# Patient Record
Sex: Male | Born: 1960 | Race: Black or African American | Hispanic: No | Smoking: Never smoker
Health system: Southern US, Community
[De-identification: ages and names within clinical notes are randomized; demographics above are authoritative.]

## PROBLEM LIST (undated history)

## (undated) DIAGNOSIS — T884XXA Failed or difficult intubation, initial encounter: Secondary | ICD-10-CM

## (undated) DIAGNOSIS — I10 Essential (primary) hypertension: Secondary | ICD-10-CM

## (undated) DIAGNOSIS — T8859XA Other complications of anesthesia, initial encounter: Secondary | ICD-10-CM

## (undated) DIAGNOSIS — C801 Malignant (primary) neoplasm, unspecified: Secondary | ICD-10-CM

## (undated) DIAGNOSIS — N189 Chronic kidney disease, unspecified: Secondary | ICD-10-CM

## (undated) DIAGNOSIS — Z8709 Personal history of other diseases of the respiratory system: Secondary | ICD-10-CM

## (undated) DIAGNOSIS — I4891 Unspecified atrial fibrillation: Secondary | ICD-10-CM

## (undated) HISTORY — PX: NO PAST SURGERIES: SHX2092

---

## 2004-02-25 ENCOUNTER — Emergency Department (HOSPITAL_COMMUNITY): Admission: EM | Admit: 2004-02-25 | Discharge: 2004-02-25 | Payer: Self-pay | Admitting: Family Medicine

## 2006-09-16 ENCOUNTER — Emergency Department (HOSPITAL_COMMUNITY): Admission: EM | Admit: 2006-09-16 | Discharge: 2006-09-16 | Payer: Self-pay | Admitting: Emergency Medicine

## 2006-10-01 ENCOUNTER — Emergency Department (HOSPITAL_COMMUNITY): Admission: EM | Admit: 2006-10-01 | Discharge: 2006-10-01 | Payer: Self-pay | Admitting: Emergency Medicine

## 2020-02-18 ENCOUNTER — Ambulatory Visit: Payer: Self-pay

## 2020-02-22 ENCOUNTER — Ambulatory Visit: Payer: Self-pay | Attending: Family

## 2020-02-22 DIAGNOSIS — Z23 Encounter for immunization: Secondary | ICD-10-CM | POA: Insufficient documentation

## 2020-02-22 NOTE — Progress Notes (Signed)
   Covid-19 Vaccination Clinic  Name:  Raymond Clayton    MRN: [id number] DOB: 1961/10/22  02/22/2020  [name] was observed post Covid-19 immunization for 15 minutes without incidence. He was provided with Vaccine Information Sheet and instruction to access the V-Safe system.   [name] was instructed to call 911 with any severe reactions post vaccine: Marland Kitchen Difficulty breathing  . Swelling of your face and throat  . A fast heartbeat  . A bad rash all over your body  . Dizziness and weakness    Immunizations Administered    Name Date Dose VIS Date Route   Moderna COVID-19 Vaccine 02/22/2020 11:26 AM 0.5 mL 12/01/2019 Intramuscular   Manufacturer: Moderna   Lot: [id number]   [place]:[id number]

## 2020-03-22 ENCOUNTER — Ambulatory Visit: Payer: Self-pay | Attending: Family

## 2020-03-22 DIAGNOSIS — Z23 Encounter for immunization: Secondary | ICD-10-CM

## 2020-03-22 NOTE — Progress Notes (Signed)
   Covid-19 Vaccination Clinic  Name:  Raymond Clayton    MRN: [id number] DOB: [birth year]  03/22/2020  [name] was observed post Covid-19 immunization for 30 minutes based on pre-vaccination screening without incident. He was provided with Vaccine Information Sheet and instruction to access the V-Safe system.   [name] was instructed to call 911 with any severe reactions post vaccine: Marland Kitchen Difficulty breathing  . Swelling of face and throat  . A fast heartbeat  . A bad rash all over body  . Dizziness and weakness   Immunizations Administered    Name Date Dose VIS Date Route   Moderna COVID-19 Vaccine 03/22/2020  1:46 PM 0.5 mL 12/01/2019 Intramuscular   Manufacturer: Moderna   LotMV:[id number]   LoloBE:[id number]

## 2021-08-02 NOTE — Progress Notes (Addendum)
GU Location of Tumor / Histology:  Adenocarcinoma of the prostate  If Prostate Cancer, Gleason Score is (3 + 4), PSA (14.2 as of 05/15/21), and Prostate volume (57g)  Raymond Clayton presented with signs/symptoms of: steadily increasing PSA levels, as well as: erectile dysfunction (for over a year), daytime urgency/frequency, occasional hesitancy and weak/intermittent stream, and some postvoid dribbling   Biopsies revealed:  07/20/2021   08/21/2019   Past/Anticipated interventions by urology, if any:  07/28/2021 (office visit) --Dr. Festus Aloe  07/20/2021 Dr. Festus Aloe Transrectal ultrasound of prostate with biopsies   08/21/2019 Dr. Festus Aloe Transrectal ultrasound of prostate with biopsies   Past/Anticipated interventions by medical oncology, if any:  No referral placed at this time  Weight changes, if any: Patient denies  IPSS Score: 8 (moderate) SHIM Score: 14 (mild-moderate ED)  Bowel/Bladder complaints, if any:  Denies any changes in bowel or urinary habits. Reports he would be mostly satisfied if he had to live with his current urinary condition for the rest of his life  Nausea/Vomiting, if any: Patient denies  Pain issues, if any:  Patient denies  SAFETY ISSUES: Prior radiation? No Pacemaker/ICD? No Possible current pregnancy? N/A Is the patient on methotrexate? No  Current Complaints / other details:  Patient has received the first 3 Moderna vaccines

## 2021-08-03 DIAGNOSIS — C61 Malignant neoplasm of prostate: Secondary | ICD-10-CM | POA: Insufficient documentation

## 2021-08-04 ENCOUNTER — Ambulatory Visit
Admission: RE | Admit: 2021-08-04 | Discharge: 2021-08-04 | Disposition: A | Discharge status: Home / Self Care | Payer: Self-pay | Source: Ambulatory Visit | Attending: Radiation Oncology | Admitting: Radiation Oncology

## 2021-08-04 ENCOUNTER — Encounter: Payer: Self-pay | Admitting: Radiation Oncology

## 2021-08-04 ENCOUNTER — Ambulatory Visit: Admission: RE | Admit: 2021-08-04 | Payer: Self-pay | Source: Ambulatory Visit

## 2021-08-04 DIAGNOSIS — C61 Malignant neoplasm of prostate: Secondary | ICD-10-CM

## 2021-08-04 NOTE — Progress Notes (Signed)
Radiation Oncology         [phone] ________________________________  Initial Outpatient Consultation - Conducted via Telephone due to current COVID-19 concerns for limiting patient exposure  Name: Raymond Clayton MRN: [id number]  Date: 08/04/2021  DOB: 07-30-1961  CC:No primary care provider on file.  Festus Aloe, MD   REFERRING PHYSICIAN: Festus Aloe, MD  DIAGNOSIS: 60 y.o. gentleman with Stage T1c adenocarcinoma of the prostate with Gleason score of 3+4, and PSA of 14.2.    ICD-10-CM   1. Malignant neoplasm of prostate ([place])  C61       HISTORY OF PRESENT ILLNESS: Raymond Clayton is a 60 y.o. male with a diagnosis of prostate cancer. He was initially diagnosed with low volume Gleason 3+3 prostate cancer in 08/2019 by Dr. Junious Silk. PSA at that time was 8.3, and digital rectal examination was benign. He appropriately opted for active surveillance.   Since that time, his PSA jumped to 10.7 in 05/2020 and to 14.2 in 04/2021. He proceeded to surveillance transrectal ultrasound with 12 biopsies of the prostate on 07/20/21.  The prostate volume measured 40 cc.  Out of 12 core biopsies, 4 were positive.  The maximum Gleason score was 3+4, and this was seen in the right apex lateral and right apex. Additionally, small foci of Gleason 3+3 were seen in the left base lateral and left mid  The patient reviewed the biopsy results with his urologist and he has kindly been referred today for discussion of potential radiation treatment options. Following his discussion with Dr. Junious Silk, he was adamantly not interested in prostatectomy and most interested in brachytherapy.  PREVIOUS RADIATION THERAPY: No  PAST MEDICAL HISTORY: History reviewed. No pertinent past medical history.    PAST SURGICAL HISTORY:History reviewed. No pertinent surgical history.  FAMILY HISTORY: History reviewed. No pertinent family history.  SOCIAL HISTORY:  Social History   Socioeconomic History    Marital status: Married    Spouse name: Not on file   Number of children: Not on file   Years of education: Not on file   Highest education level: Not on file  Occupational History   Not on file  Tobacco Use   Smoking status: Not on file   Smokeless tobacco: Not on file  Substance and Sexual Activity   Alcohol use: Not on file   Drug use: Not on file   Sexual activity: Not on file  Other Topics Concern   Not on file  Social History Narrative   Not on file   Social Determinants of Health   Financial Resource Strain: Not on file  Food Insecurity: Not on file  Transportation Needs: Not on file  Physical Activity: Not on file  Stress: Not on file  Social Connections: Not on file  Intimate Partner Violence: Not on file    ALLERGIES: Lisinopril  MEDICATIONS:  Current Outpatient Medications  Medication Sig Dispense Refill   amLODipine (NORVASC) 10 MG tablet Take 10 mg by mouth daily.     atorvastatin (LIPITOR) 10 MG tablet Take 10 mg by mouth daily.     losartan-hydrochlorothiazide (HYZAAR) 50-12.5 MG tablet Take 1 tablet by mouth daily.     metoprolol tartrate (LOPRESSOR) 100 MG tablet Take 100 mg by mouth in the morning and at bedtime.     Rivaroxaban (XARELTO) 15 MG TABS tablet Take 1 tablet by mouth daily.     Cholecalciferol (VITAMIN D3) 10 MCG (400 UNIT) tablet Take 400 Units by mouth daily.     No current facility-administered  medications for this encounter.    REVIEW OF SYSTEMS:  On review of systems, the patient reports that he is doing well overall. He denies any chest pain, shortness of breath, cough, fevers, chills, night sweats, unintended weight changes. He denies any bowel disturbances, and denies abdominal pain, nausea or vomiting. He denies any new musculoskeletal or joint aches or pains. His IPSS was 8, indicating mild urinary symptoms. His SHIM was 14, indicating he has moderate erectile dysfunction. A complete review of systems is obtained and is otherwise  negative.    PHYSICAL EXAM:  Wt Readings from Last 3 Encounters:  No data found for Wt   Temp Readings from Last 3 Encounters:  No data found for Temp   BP Readings from Last 3 Encounters:  No data found for BP   Pulse Readings from Last 3 Encounters:  No data found for Pulse    /10  Physical exam not performed in light of telephone consult visit format.   KPS = 100  100 - Normal; no complaints; no evidence of disease. 90   - Able to carry on normal activity; minor signs or symptoms of disease. 80   - Normal activity with effort; some signs or symptoms of disease. 57   - Cares for self; unable to carry on normal activity or to do active work. 60   - Requires occasional assistance, but is able to care for most of his personal needs. 50   - Requires considerable assistance and frequent medical care. 17   - Disabled; requires special care and assistance. 92   - Severely disabled; hospital admission is indicated although death not imminent. 14   - Very sick; hospital admission necessary; active supportive treatment necessary. 10   - Moribund; fatal processes progressing rapidly. 0     - Dead  Karnofsky DA, Abelmann WH, Craver LS and Burchenal JH [phone]) The use of the nitrogen mustards in the palliative treatment of carcinoma: with particular reference to bronchogenic carcinoma Cancer 1 634-56  LABORATORY DATA:  No results found for: WBC, HGB, HCT, MCV, PLT No results found for: NA, K, CL, CO2 No results found for: ALT, AST, GGT, ALKPHOS, BILITOT   RADIOGRAPHY: No results found.    IMPRESSION/PLAN: This visit was conducted via Telephone to spare the patient unnecessary potential exposure in the healthcare setting during the current COVID-19 pandemic. 1. 60 y.o. gentleman with Stage T1c adenocarcinoma of the prostate with Gleason Score of 3+4, and PSA of 14.2. We discussed the patient's workup and outlined the nature of prostate cancer in this setting. The patient's T stage,  Gleason's score, and PSA put him into the intermediate risk group. Accordingly, he is eligible for a variety of potential treatment options including brachytherapy, 5.5 weeks of external radiation, or prostatectomy. We discussed the available radiation techniques, and focused on the details and logistics and delivery. We discussed and outlined the risks, benefits, short and long-term effects associated with radiotherapy and compared and contrasted these with prostatectomy. We discussed the role of SpaceOAR in reducing the rectal toxicity associated with radiotherapy. He appears to have a good understanding of his disease and our treatment recommendations which are of curative intent.  He was encouraged to ask questions that were answered to his stated satisfaction.  At the end of the conversation the patient is interested in moving forward with brachytherapy and use of SpaceOAR gel to reduce rectal toxicity from radiotherapy.  We will share our discussion with Dr. Junious Silk and move forward  with scheduling his [facility] planning appointment in the near future.  The patient will be contacted by Romie Jumper in our office who will be working closely with him to coordinate OR scheduling and pre and post procedure appointments.  We will contact the pharmaceutical rep to ensure that [employer] is available at the time of procedure.  We enjoyed meeting him today and look forward to continuing to participate in his care.  Given current concerns for patient exposure during the COVID-19 pandemic, this encounter was conducted via telephone. The patient was notified in advance and was offered a MyChart meeting to allow for face to face communication but unfortunately reported that he did not have the appropriate resources/technology to support such a visit and instead preferred to proceed with telephone consult. The patient has given verbal consent for this type of encounter. The attendants for this meeting include Tyler Pita MD, Ashlyn Bruning PA-C, [place], and patient, Wenceslao Gromek. During the encounter, Tyler Pita MD, Ashlyn Bruning PA-C, and scribe, Wilburn Mylar were located at [place].  Patient, Shinichi Deguia was located at home.  We personally spent 60 minutes in this encounter including chart review, reviewing radiological studies, meeting face-to-face with the patient, entering orders and completing documentation.   Nicholos Johns, PA-C    Tyler Pita, MD  [facility] Direct Dial: [phone]  Fax: [phone] .com  Skype  LinkedIn  This document serves as a record of services personally performed by Tyler Pita, MD and Freeman Caldron, PA-C. It was created on their behalf by Wilburn Mylar, a trained medical scribe. The creation of this record is based on the scribe's personal observations and the provider's statements to them. This document has been checked and approved by the attending provider.

## 2021-08-07 ENCOUNTER — Telehealth: Payer: Self-pay | Admitting: *Deleted

## 2021-08-07 NOTE — Telephone Encounter (Signed)
CALLED PATIENT TO ASK QUESTIONS, SPOKE WITH PATIENT 

## 2021-08-16 ENCOUNTER — Telehealth: Payer: Self-pay | Admitting: *Deleted

## 2021-08-16 ENCOUNTER — Other Ambulatory Visit: Payer: Self-pay | Admitting: Urology

## 2021-08-16 DIAGNOSIS — C61 Malignant neoplasm of prostate: Secondary | ICD-10-CM

## 2021-08-16 NOTE — Telephone Encounter (Signed)
CALLED PATIENT TO INFORM OF IMPLANT DATE, LVM FOR A RETURN CALL 

## 2021-08-17 ENCOUNTER — Telehealth: Payer: Self-pay | Admitting: *Deleted

## 2021-08-17 NOTE — Telephone Encounter (Signed)
Returned patient's phone call, spoke with patient 

## 2021-10-04 ENCOUNTER — Telehealth: Payer: Self-pay | Admitting: *Deleted

## 2021-10-04 NOTE — Telephone Encounter (Signed)
CALLED PATIENT TO REMIND OF PRE-SEED APPTS. FOR 10-05-21, SPOKE WITH PATIENT AND HE IS AWARE OF THESE APPTS.

## 2021-10-05 ENCOUNTER — Ambulatory Visit
Admission: RE | Admit: 2021-10-05 | Discharge: 2021-10-05 | Disposition: A | Discharge status: Home / Self Care | Payer: 59 | Source: Ambulatory Visit | Attending: Urology | Admitting: Urology

## 2021-10-05 ENCOUNTER — Encounter (HOSPITAL_COMMUNITY)
Admission: RE | Admit: 2021-10-05 | Discharge: 2021-10-05 | Disposition: A | Discharge status: Home / Self Care | Payer: 59 | Source: Ambulatory Visit | Attending: Urology | Admitting: Urology

## 2021-10-05 ENCOUNTER — Ambulatory Visit
Admission: RE | Admit: 2021-10-05 | Discharge: 2021-10-05 | Disposition: A | Discharge status: Home / Self Care | Payer: 59 | Source: Ambulatory Visit | Attending: Radiation Oncology | Admitting: Radiation Oncology

## 2021-10-05 ENCOUNTER — Encounter: Payer: Self-pay | Admitting: Urology

## 2021-10-05 ENCOUNTER — Other Ambulatory Visit: Payer: Self-pay

## 2021-10-05 ENCOUNTER — Ambulatory Visit (HOSPITAL_COMMUNITY)
Admission: RE | Admit: 2021-10-05 | Discharge: 2021-10-05 | Disposition: A | Discharge status: Home / Self Care | Payer: 59 | Source: Ambulatory Visit | Attending: Urology | Admitting: Urology

## 2021-10-05 DIAGNOSIS — C61 Malignant neoplasm of prostate: Secondary | ICD-10-CM | POA: Insufficient documentation

## 2021-10-05 NOTE — Progress Notes (Signed)
  Radiation Oncology         [phone] ________________________________  Name: Raymond Clayton MRN: [id number]  Date: 10/05/2021  DOB: 1961-06-19  SIMULATION AND TREATMENT PLANNING NOTE PUBIC ARCH STUDY  CC:No primary care provider on file.  Festus Aloe, MD  DIAGNOSIS: 60 y.o. gentleman with Stage T1c adenocarcinoma of the prostate with Gleason score of 3+4, and PSA of 14.2.  Oncology History  Malignant neoplasm of prostate ([place])  07/20/2021 Cancer Staging   Staging form: Prostate, AJCC 8th Edition - Clinical stage from 07/20/2021: Stage IIB (cT1c, cN0, cM0, PSA: 14.2, Grade Group: 2) - Signed by Freeman Caldron, PA-C on 08/03/2021 Histopathologic type: Adenocarcinoma, NOS Stage prefix: Initial diagnosis Prostate specific antigen (PSA) range: 10 to 19 Gleason primary pattern: 3 Gleason secondary pattern: 4 Gleason score: 7 Histologic grading system: 5 grade system Number of biopsy cores examined: 12 Number of biopsy cores positive: 4 Location of positive needle core biopsies: Both sides   08/03/2021 Initial Diagnosis   Malignant neoplasm of prostate ([place])       ICD-10-CM   1. Malignant neoplasm of prostate ([place])  C61       COMPLEX SIMULATION:  The patient presented today for evaluation for possible prostate seed implant. He was brought to the radiation planning suite and placed supine on the CT couch. A 3-dimensional image study set was obtained in upload to the planning computer. There, on each axial slice, I contoured the prostate gland. Then, using three-dimensional radiation planning tools I reconstructed the prostate in view of the structures from the transperineal needle pathway to assess for possible pubic arch interference. In doing so, I did not appreciate any pubic arch interference. Also, the patient's prostate volume was estimated based on the drawn structure. The volume was 42 cc.  Given the pubic arch appearance and prostate volume, patient remains a good  candidate to proceed with prostate seed implant. Today, he freely provided informed written consent to proceed.    PLAN: The patient will undergo prostate seed implant.   ________________________________  Sheral Apley. Tammi Klippel, M.D.

## 2021-10-05 NOTE — Progress Notes (Addendum)
Patient reports doing well. No symptoms reported at this time.  No current urinary management medications and no urology follow-up scheduled at this time.  Meaningful [place] e complete.

## 2021-10-30 ENCOUNTER — Other Ambulatory Visit: Payer: Self-pay

## 2021-10-30 ENCOUNTER — Telehealth: Payer: Self-pay | Admitting: *Deleted

## 2021-10-30 ENCOUNTER — Encounter (HOSPITAL_BASED_OUTPATIENT_CLINIC_OR_DEPARTMENT_OTHER): Payer: Self-pay | Admitting: Urology

## 2021-10-30 DIAGNOSIS — R3912 Poor urinary stream: Secondary | ICD-10-CM

## 2021-10-30 HISTORY — DX: Poor urinary stream: R39.12

## 2021-10-30 NOTE — Progress Notes (Addendum)
Spoke w/ via phone for pre-op interview---pt Lab needs dos----   addendum:  pt ( made connie mabe aware pt elevated at pre op labs  , please make dr eskridge aware will repeat pt day of surgery)       Lab results------lab appt  11-01-2021 930 am for cbc cmp pt ptt COVID test -----patient states asymptomatic no test needed Arrive at -------1015 am 11-03-2021 NPO after MN NO Solid Food.  Clear liquids from MN until---915 am Med rec completed Medications to take morning of surgery -----amlodipine, atorvastatin, metoprolol tartrate Diabetic medication -----n/a Patient instructed no nail polish to be worn day of surgery Patient instructed to bring photo id and insurance card day of surgery Patient aware to have Driver (ride ) / caregiver    for 24 hours after surgery  pt aware will need driver caregiver for day of surgery and to call connie at dr eskridge before lab appt 11-01-2021 if cannot arrange driver Patient Special Instructions -----fleets enema am of surgery Pre-Op special Istructions -----none Patient verbalized understanding of instructions that were given at this phone interview. Patient denies shortness of breath, chest pain, fever, cough at this phone interview.   Anesthesia Review:  PCP: dr Lonell Face 05-15-2021 [facility] Cardiologist :dr Celine Ahr 07-07-2021 [facility] nephrology dr Candy Sledge 08-01-2021 [facility] ( stage 3 b ckd) Chest x-ray :10-05-2021 chart/epic EKG :10-05-2021 chart/epic Echo :03-07-2021 [facility] Stress test:none Cardiac Cath : none Activity level: does own housework and can climb flight of stairs without problems Sleep Study/ CPAP :none Blood Thinner/ Instructions /Last Dose:patient aware last dose of xarelto will be 10-30-2021 per  note on chart dr Chrissie Noa rhinehart dated 08-16-2021 on chart. :

## 2021-10-30 NOTE — Telephone Encounter (Signed)
Called patient to remind of  lab appt. for 11-01-21 @ 9::30 am @ [facility], spoke with patient and he is aware of this appt.

## 2021-11-01 ENCOUNTER — Other Ambulatory Visit: Payer: Self-pay

## 2021-11-01 ENCOUNTER — Encounter (HOSPITAL_COMMUNITY)
Admission: RE | Admit: 2021-11-01 | Discharge: 2021-11-01 | Disposition: A | Discharge status: Home / Self Care | Payer: 59 | Source: Ambulatory Visit | Attending: Urology | Admitting: Urology

## 2021-11-01 DIAGNOSIS — Z01812 Encounter for preprocedural laboratory examination: Secondary | ICD-10-CM | POA: Insufficient documentation

## 2021-11-01 LAB — COMPREHENSIVE METABOLIC PANEL
ALT: 17 U/L (ref 0–44)
AST: 19 U/L (ref 15–41)
Albumin: 3.6 g/dL (ref 3.5–5.0)
Alkaline Phosphatase: 40 U/L (ref 38–126)
Anion gap: 8 (ref 5–15)
BUN: 13 mg/dL (ref 8–23)
CO2: 26 mmol/L (ref 22–32)
Calcium: 8.9 mg/dL (ref 8.9–10.3)
Chloride: 103 mmol/L (ref 98–111)
Creatinine, Ser: 1.81 mg/dL — ABNORMAL HIGH (ref 0.61–1.24)
GFR, Estimated: 42 mL/min — ABNORMAL LOW (ref >60)
Glucose, Bld: 106 mg/dL — ABNORMAL HIGH (ref 70–99)
Potassium: 3.7 mmol/L (ref 3.5–5.1)
Sodium: 137 mmol/L (ref 135–145)
Total Bilirubin: 1.4 mg/dL — ABNORMAL HIGH (ref 0.3–1.2)
Total Protein: 8 g/dL (ref 6.5–8.1)

## 2021-11-01 LAB — CBC
HCT: 49.1 % (ref 39.0–52.0)
Hemoglobin: 16.2 g/dL (ref 13.0–17.0)
MCH: 28.7 pg (ref 26.0–34.0)
MCHC: 33 g/dL (ref 30.0–36.0)
MCV: 87.1 fL (ref 80.0–100.0)
Platelets: 234 10*3/uL (ref 150–400)
RBC: 5.64 MIL/uL (ref 4.22–5.81)
RDW: 14.4 % (ref 11.5–15.5)
WBC: 6.5 10*3/uL (ref 4.0–10.5)
nRBC: 0 % (ref 0.0–0.2)

## 2021-11-01 LAB — APTT: aPTT: 30 seconds (ref 24–36)

## 2021-11-01 LAB — PROTIME-INR
INR: 1.2 (ref 0.8–1.2)
Prothrombin Time: 15.6 seconds — ABNORMAL HIGH (ref 11.4–15.2)

## 2021-11-02 ENCOUNTER — Telehealth: Payer: Self-pay | Admitting: *Deleted

## 2021-11-02 NOTE — Telephone Encounter (Signed)
CALLED PATIENT TO REMIND OF PROCEDURE FOR 11-03-21, LVM FOR A RETURN CALL

## 2021-11-03 ENCOUNTER — Encounter (HOSPITAL_BASED_OUTPATIENT_CLINIC_OR_DEPARTMENT_OTHER)
Admission: RE | Disposition: A | Discharge status: Home / Self Care | Payer: Self-pay | Source: Ambulatory Visit | Attending: Urology

## 2021-11-03 ENCOUNTER — Ambulatory Visit (HOSPITAL_BASED_OUTPATIENT_CLINIC_OR_DEPARTMENT_OTHER)
Admission: RE | Admit: 2021-11-03 | Discharge: 2021-11-03 | Disposition: A | Discharge status: Home / Self Care | Payer: 59 | Source: Ambulatory Visit | Attending: Urology | Admitting: Urology

## 2021-11-03 ENCOUNTER — Ambulatory Visit (HOSPITAL_BASED_OUTPATIENT_CLINIC_OR_DEPARTMENT_OTHER): Payer: 59 | Admitting: Anesthesiology

## 2021-11-03 ENCOUNTER — Other Ambulatory Visit: Payer: Self-pay

## 2021-11-03 ENCOUNTER — Ambulatory Visit (HOSPITAL_COMMUNITY): Payer: 59

## 2021-11-03 ENCOUNTER — Encounter (HOSPITAL_BASED_OUTPATIENT_CLINIC_OR_DEPARTMENT_OTHER): Payer: Self-pay | Admitting: Urology

## 2021-11-03 DIAGNOSIS — C61 Malignant neoplasm of prostate: Secondary | ICD-10-CM | POA: Insufficient documentation

## 2021-11-03 DIAGNOSIS — Z888 Allergy status to other drugs, medicaments and biological substances status: Secondary | ICD-10-CM | POA: Diagnosis not present

## 2021-11-03 DIAGNOSIS — N1832 Chronic kidney disease, stage 3b: Secondary | ICD-10-CM | POA: Diagnosis not present

## 2021-11-03 DIAGNOSIS — Z7901 Long term (current) use of anticoagulants: Secondary | ICD-10-CM | POA: Insufficient documentation

## 2021-11-03 DIAGNOSIS — I129 Hypertensive chronic kidney disease with stage 1 through stage 4 chronic kidney disease, or unspecified chronic kidney disease: Secondary | ICD-10-CM | POA: Diagnosis not present

## 2021-11-03 DIAGNOSIS — I4891 Unspecified atrial fibrillation: Secondary | ICD-10-CM | POA: Diagnosis not present

## 2021-11-03 DIAGNOSIS — T884XXA Failed or difficult intubation, initial encounter: Secondary | ICD-10-CM

## 2021-11-03 DIAGNOSIS — Z01818 Encounter for other preprocedural examination: Secondary | ICD-10-CM

## 2021-11-03 HISTORY — DX: Failed or difficult intubation, initial encounter: T88.4XXA

## 2021-11-03 HISTORY — PX: SPACE OAR INSTILLATION: SHX6769

## 2021-11-03 HISTORY — DX: Personal history of other diseases of the respiratory system: Z87.09

## 2021-11-03 HISTORY — DX: Other complications of anesthesia, initial encounter: T88.59XA

## 2021-11-03 HISTORY — DX: Unspecified atrial fibrillation: I48.91

## 2021-11-03 HISTORY — DX: Chronic kidney disease, unspecified: N18.9

## 2021-11-03 HISTORY — PX: RADIOACTIVE SEED IMPLANT: SHX5150

## 2021-11-03 HISTORY — DX: Essential (primary) hypertension: I10

## 2021-11-03 HISTORY — DX: Malignant (primary) neoplasm, unspecified: C80.1

## 2021-11-03 LAB — PROTIME-INR
INR: 1 (ref 0.8–1.2)
Prothrombin Time: 13.1 seconds (ref 11.4–15.2)

## 2021-11-03 LAB — URINALYSIS, ROUTINE W REFLEX MICROSCOPIC
Bilirubin Urine: NEGATIVE
Glucose, UA: NEGATIVE mg/dL
Hgb urine dipstick: NEGATIVE
Ketones, ur: NEGATIVE mg/dL
Leukocytes,Ua: NEGATIVE
Nitrite: NEGATIVE
Protein, ur: NEGATIVE mg/dL
Specific Gravity, Urine: 1.018 (ref 1.005–1.030)
pH: 5 (ref 5.0–8.0)

## 2021-11-03 LAB — PSA: Prostatic Specific Antigen: 16.3 ng/mL — ABNORMAL HIGH (ref 0.00–4.00)

## 2021-11-03 SURGERY — INSERTION, RADIATION SOURCE, PROSTATE
Anesthesia: General | Site: Prostate

## 2021-11-03 MED ORDER — PROPOFOL 10 MG/ML IV BOLUS
INTRAVENOUS | Status: DC | PRN
  Administered 2021-11-03: 200 mg via INTRAVENOUS
  Administered 2021-11-03 (×2): 50 mg via INTRAVENOUS

## 2021-11-03 MED ORDER — ALBUTEROL SULFATE HFA 108 (90 BASE) MCG/ACT IN AERS
INHALATION_SPRAY | RESPIRATORY_TRACT | Status: DC | PRN
  Administered 2021-11-03 (×2): 2 via RESPIRATORY_TRACT
  Administered 2021-11-03: 6 via RESPIRATORY_TRACT
  Administered 2021-11-03: 2 via RESPIRATORY_TRACT

## 2021-11-03 MED ORDER — FENTANYL CITRATE (PF) 100 MCG/2ML IJ SOLN
INTRAMUSCULAR | Status: AC
  Filled 2021-11-03: qty 2

## 2021-11-03 MED ORDER — ONDANSETRON HCL 4 MG/2ML IJ SOLN
INTRAMUSCULAR | Status: AC
  Filled 2021-11-03: qty 2

## 2021-11-03 MED ORDER — METHYLPREDNISOLONE SODIUM SUCC 125 MG IJ SOLR
INTRAMUSCULAR | Status: DC | PRN
  Administered 2021-11-03: 125 mg via INTRAVENOUS

## 2021-11-03 MED ORDER — PHENYLEPHRINE 40 MCG/ML (10ML) SYRINGE FOR IV PUSH (FOR BLOOD PRESSURE SUPPORT)
PREFILLED_SYRINGE | INTRAVENOUS | Status: AC
  Filled 2021-11-03: qty 10

## 2021-11-03 MED ORDER — SUCCINYLCHOLINE CHLORIDE 200 MG/10ML IV SOSY
PREFILLED_SYRINGE | INTRAVENOUS | Status: DC | PRN
  Administered 2021-11-03: 140 mg via INTRAVENOUS

## 2021-11-03 MED ORDER — SODIUM CHLORIDE 0.9 % IR SOLN
Status: DC | PRN
  Administered 2021-11-03: 1000 mL

## 2021-11-03 MED ORDER — OXYCODONE-ACETAMINOPHEN 7.5-325 MG PO TABS: 1.0000 | ORAL_TABLET | ORAL | 0 refills | Status: DC | PRN

## 2021-11-03 MED ORDER — ONDANSETRON HCL 4 MG/2ML IJ SOLN
INTRAMUSCULAR | Status: DC | PRN
  Administered 2021-11-03: 4 mg via INTRAVENOUS

## 2021-11-03 MED ORDER — OXYCODONE HCL 5 MG PO TABS: 5.0000 mg | ORAL_TABLET | Freq: Once | ORAL | Status: DC | PRN

## 2021-11-03 MED ORDER — CEPHALEXIN 500 MG PO CAPS: 500.0000 mg | ORAL_CAPSULE | Freq: Three times a day (TID) | ORAL | 0 refills | Status: AC

## 2021-11-03 MED ORDER — RIVAROXABAN 15 MG PO TABS: 15.0000 mg | ORAL_TABLET | Freq: Every day | ORAL | Status: AC

## 2021-11-03 MED ORDER — IOHEXOL 300 MG/ML  SOLN
INTRAMUSCULAR | Status: DC | PRN
  Administered 2021-11-03: 7 mL

## 2021-11-03 MED ORDER — ALBUTEROL SULFATE HFA 108 (90 BASE) MCG/ACT IN AERS
INHALATION_SPRAY | RESPIRATORY_TRACT | Status: AC
  Filled 2021-11-03: qty 6.7

## 2021-11-03 MED ORDER — MIDAZOLAM HCL 2 MG/2ML IJ SOLN
INTRAMUSCULAR | Status: AC
  Filled 2021-11-03: qty 2

## 2021-11-03 MED ORDER — HYDROMORPHONE HCL 1 MG/ML IJ SOLN: 0.2500 mg | INTRAMUSCULAR | Status: DC | PRN

## 2021-11-03 MED ORDER — ONDANSETRON HCL 4 MG/2ML IJ SOLN
4.0000 mg | Freq: Once | INTRAMUSCULAR | Status: DC | PRN
Start: 2021-11-03 — End: 2021-11-03

## 2021-11-03 MED ORDER — CIPROFLOXACIN IN D5W 400 MG/200ML IV SOLN
INTRAVENOUS | Status: AC
  Filled 2021-11-03: qty 200

## 2021-11-03 MED ORDER — SODIUM CHLORIDE 0.9 % IV SOLN: INTRAVENOUS | Status: DC | PRN

## 2021-11-03 MED ORDER — DEXAMETHASONE SODIUM PHOSPHATE 10 MG/ML IJ SOLN
INTRAMUSCULAR | Status: DC | PRN
  Administered 2021-11-03 (×2): 10 mg via INTRAVENOUS

## 2021-11-03 MED ORDER — ACETAMINOPHEN 500 MG PO TABS
ORAL_TABLET | ORAL | Status: AC
  Filled 2021-11-03: qty 2

## 2021-11-03 MED ORDER — PHENYLEPHRINE HCL-NACL 20-0.9 MG/250ML-% IV SOLN
INTRAVENOUS | Status: DC | PRN
  Administered 2021-11-03: 20 ug/min via INTRAVENOUS

## 2021-11-03 MED ORDER — PHENYLEPHRINE HCL (PRESSORS) 10 MG/ML IV SOLN
INTRAVENOUS | Status: AC
  Filled 2021-11-03: qty 2

## 2021-11-03 MED ORDER — DEXMEDETOMIDINE (PRECEDEX) IN NS 20 MCG/5ML (4 MCG/ML) IV SYRINGE
PREFILLED_SYRINGE | INTRAVENOUS | Status: AC
  Filled 2021-11-03: qty 5

## 2021-11-03 MED ORDER — LIDOCAINE 2% (20 MG/ML) 5 ML SYRINGE
INTRAMUSCULAR | Status: DC | PRN
  Administered 2021-11-03: 100 mg via INTRAVENOUS

## 2021-11-03 MED ORDER — FENTANYL CITRATE (PF) 100 MCG/2ML IJ SOLN
INTRAMUSCULAR | Status: DC | PRN
  Administered 2021-11-03: 25 ug via INTRAVENOUS
  Administered 2021-11-03: 50 ug via INTRAVENOUS
  Administered 2021-11-03: 25 ug via INTRAVENOUS
  Administered 2021-11-03: 50 ug via INTRAVENOUS

## 2021-11-03 MED ORDER — DEXMEDETOMIDINE (PRECEDEX) IN NS 20 MCG/5ML (4 MCG/ML) IV SYRINGE
PREFILLED_SYRINGE | INTRAVENOUS | Status: DC | PRN
  Administered 2021-11-03: 12 ug via INTRAVENOUS

## 2021-11-03 MED ORDER — AMISULPRIDE (ANTIEMETIC) 5 MG/2ML IV SOLN: 10.0000 mg | Freq: Once | INTRAVENOUS | Status: DC | PRN

## 2021-11-03 MED ORDER — 0.9 % SODIUM CHLORIDE (POUR BTL) OPTIME: TOPICAL | Status: DC | PRN

## 2021-11-03 MED ORDER — ACETAMINOPHEN 500 MG PO TABS
1000.0000 mg | ORAL_TABLET | Freq: Once | ORAL | Status: AC
  Administered 2021-11-03: 1000 mg via ORAL

## 2021-11-03 MED ORDER — PROPOFOL 10 MG/ML IV BOLUS
INTRAVENOUS | Status: AC
  Filled 2021-11-03: qty 20

## 2021-11-03 MED ORDER — DEXAMETHASONE SODIUM PHOSPHATE 10 MG/ML IJ SOLN
INTRAMUSCULAR | Status: AC
  Filled 2021-11-03: qty 1

## 2021-11-03 MED ORDER — SODIUM CHLORIDE FLUSH 0.9 % IV SOLN
INTRAVENOUS | Status: DC | PRN
  Administered 2021-11-03: 3 mL via INTRAVENOUS

## 2021-11-03 MED ORDER — DOCUSATE SODIUM 100 MG PO CAPS: 100.0000 mg | ORAL_CAPSULE | Freq: Two times a day (BID) | ORAL | 0 refills | Status: AC

## 2021-11-03 MED ORDER — SODIUM CHLORIDE 0.9 % IV SOLN: INTRAVENOUS | Status: DC

## 2021-11-03 MED ORDER — CIPROFLOXACIN IN D5W 400 MG/200ML IV SOLN
400.0000 mg | INTRAVENOUS | Status: AC
  Administered 2021-11-03: 400 mg via INTRAVENOUS

## 2021-11-03 MED ORDER — PHENYLEPHRINE 40 MCG/ML (10ML) SYRINGE FOR IV PUSH (FOR BLOOD PRESSURE SUPPORT)
PREFILLED_SYRINGE | INTRAVENOUS | Status: DC | PRN
  Administered 2021-11-03: 80 ug via INTRAVENOUS
  Administered 2021-11-03 (×2): 120 ug via INTRAVENOUS
  Administered 2021-11-03: 80 ug via INTRAVENOUS

## 2021-11-03 MED ORDER — OXYCODONE HCL 5 MG/5ML PO SOLN
5.0000 mg | Freq: Once | ORAL | Status: DC | PRN
Start: 2021-11-03 — End: 2021-11-03

## 2021-11-03 MED ORDER — LIDOCAINE 2% (20 MG/ML) 5 ML SYRINGE
INTRAMUSCULAR | Status: AC
  Filled 2021-11-03: qty 5

## 2021-11-03 MED ORDER — FLEET ENEMA 7-19 GM/118ML RE ENEM: 1.0000 | ENEMA | Freq: Once | RECTAL | Status: DC

## 2021-11-03 MED ORDER — TAMSULOSIN HCL 0.4 MG PO CAPS: 0.4000 mg | ORAL_CAPSULE | Freq: Every day | ORAL | 0 refills | Status: AC

## 2021-11-03 MED ORDER — METHYLPREDNISOLONE SODIUM SUCC 125 MG IJ SOLR
INTRAMUSCULAR | Status: AC
  Filled 2021-11-03: qty 2

## 2021-11-03 MED ORDER — MIDAZOLAM HCL 5 MG/5ML IJ SOLN
INTRAMUSCULAR | Status: DC | PRN
  Administered 2021-11-03: 2 mg via INTRAVENOUS

## 2021-11-03 SURGICAL SUPPLY — 38 items
BAG DRN RND TRDRP ANRFLXCHMBR (UROLOGICAL SUPPLIES) ×1
BAG URINE DRAIN 2000ML AR STRL (UROLOGICAL SUPPLIES) ×2 IMPLANT
BLADE CLIPPER SENSICLIP SURGIC (BLADE) ×2 IMPLANT
Brachytherapy QuickLink Delivery System Seed Cartr ×1 IMPLANT
CATH FOLEY 2WAY SLVR  5CC 16FR (CATHETERS) ×2
CATH FOLEY 2WAY SLVR 5CC 16FR (CATHETERS) ×1 IMPLANT
CATH ROBINSON RED A/P 16FR (CATHETERS) IMPLANT
CATH ROBINSON RED A/P 20FR (CATHETERS) ×2 IMPLANT
CLOTH BEACON ORANGE TIMEOUT ST (SAFETY) ×2 IMPLANT
CNTNR URN SCR LID CUP LEK RST (MISCELLANEOUS) ×3 IMPLANT
CONT SPEC 4OZ STRL OR WHT (MISCELLANEOUS) ×6
COVER BACK TABLE 60X90IN (DRAPES) ×2 IMPLANT
COVER MAYO STAND STRL (DRAPES) ×2 IMPLANT
DECANTER SPIKE VIAL GLASS SM (MISCELLANEOUS) ×1 IMPLANT
DRSG TEGADERM 4X4.75 (GAUZE/BANDAGES/DRESSINGS) ×3 IMPLANT
DRSG TEGADERM 8X12 (GAUZE/BANDAGES/DRESSINGS) ×4 IMPLANT
GAUZE SPONGE 4X4 12PLY STRL LF (GAUZE/BANDAGES/DRESSINGS) ×2 IMPLANT
GLOVE SURG ENC MOIS LTX SZ6.5 (GLOVE) ×2 IMPLANT
GLOVE SURG ENC MOIS LTX SZ7.5 (GLOVE) ×2 IMPLANT
GLOVE SURG ENC MOIS LTX SZ8 (GLOVE) IMPLANT
GLOVE SURG NEOP MICRO LF SZ6.5 (GLOVE) ×1 IMPLANT
GLOVE SURG ORTHO LTX SZ8.5 (GLOVE) ×3 IMPLANT
GLOVE SURG UNDER POLY LF SZ6.5 (GLOVE) ×2 IMPLANT
GLOVE SURG UNDER POLY LF SZ7 (GLOVE) ×2 IMPLANT
GOWN STRL REUS W/TWL LRG LVL3 (GOWN DISPOSABLE) ×6 IMPLANT
HOLDER FOLEY CATH W/STRAP (MISCELLANEOUS) ×2 IMPLANT
IMPL SPACEOAR VUE SYSTEM (Spacer) ×1 IMPLANT
IMPLANT SPACEOAR VUE SYSTEM (Spacer) ×2 IMPLANT
IV NS 1000ML (IV SOLUTION) ×2
IV NS 1000ML BAXH (IV SOLUTION) ×1 IMPLANT
KIT TURNOVER CYSTO (KITS) ×2 IMPLANT
PACK CYSTO (CUSTOM PROCEDURE TRAY) ×2 IMPLANT
SURGILUBE 2OZ TUBE FLIPTOP (MISCELLANEOUS) ×2 IMPLANT
SUT BONE WAX W31G (SUTURE) IMPLANT
SYR 10ML LL (SYRINGE) ×2 IMPLANT
TOWEL OR 17X26 10 PK STRL BLUE (TOWEL DISPOSABLE) ×2 IMPLANT
UNDERPAD 30X36 HEAVY ABSORB (UNDERPADS AND DIAPERS) ×4 IMPLANT
WATER STERILE IRR 500ML POUR (IV SOLUTION) ×2 IMPLANT

## 2021-11-03 NOTE — Progress Notes (Signed)
  Radiation Oncology         [phone] ________________________________  Name: Raymond Clayton MRN: [id number]  Date: 11/03/2021  DOB: [birth year]       Prostate Seed Implant  CC:Bernerd Limbo, MD  No ref. provider found  DIAGNOSIS:  60 y.o. gentleman with Stage T1c adenocarcinoma of the prostate with Gleason score of 3+4, and PSA of 14.2  Oncology History  Malignant neoplasm of prostate ([place])  07/20/2021 Cancer Staging   Staging form: Prostate, AJCC 8th Edition - Clinical stage from 07/20/2021: Stage IIB (cT1c, cN0, cM0, PSA: 14.2, Grade Group: 2) - Signed by Freeman Caldron, PA-C on 08/03/2021 Histopathologic type: Adenocarcinoma, NOS Stage prefix: Initial diagnosis Prostate specific antigen (PSA) range: 10 to 19 Gleason primary pattern: 3 Gleason secondary pattern: 4 Gleason score: 7 Histologic grading system: 5 grade system Number of biopsy cores examined: 12 Number of biopsy cores positive: 4 Location of positive needle core biopsies: Both sides    08/03/2021 Initial Diagnosis   Malignant neoplasm of prostate ([place])       ICD-10-CM   1. Pre-op testing  Z01.818 Protime-INR    Protime-INR      PROCEDURE: Insertion of radioactive I-125 seeds into the prostate gland.  RADIATION DOSE: 145 Gy, definitive therapy.  TECHNIQUE: Raymond Clayton was brought to the operating room with the urologist. He was placed in the dorsolithotomy position. He was catheterized and a rectal tube was inserted. The perineum was shaved, prepped and draped. The ultrasound probe was then introduced into the rectum to see the prostate gland.  TREATMENT DEVICE: A needle grid was attached to the ultrasound probe stand and anchor needles were placed.  3D PLANNING: The prostate was imaged in 3D using a sagittal sweep of the prostate probe. These images were transferred to the planning computer. There, the prostate, urethra and rectum were defined on each axial reconstructed image. Then, the software  created an optimized 3D plan and a few seed positions were adjusted. The quality of the plan was reviewed using [facility] information for the target and the following two organs at risk:  Urethra and Rectum.  Then the accepted plan was printed and handed off to the radiation therapist.  Under my supervision, the custom loading of the seeds and spacers was carried out and loaded into sealed vicryl sleeves.  These pre-loaded needles were then placed into the needle holder.Marland Kitchen  PROSTATE VOLUME STUDY:  Using transrectal ultrasound the volume of the prostate was verified to be 55.1 cc.  SPECIAL TREATMENT PROCEDURE/SUPERVISION AND HANDLING: The pre-loaded needles were then delivered under sagittal guidance. A total of 19 needles were used to deposit 72 seeds in the prostate gland. The individual seed activity was 0.505 mCi.  SpaceOAR:  Yes  COMPLEX SIMULATION: At the end of the procedure, an anterior radiograph of the pelvis was obtained to document seed positioning and count. Cystoscopy was performed to check the urethra and bladder.  MICRODOSIMETRY: At the end of the procedure, the patient was emitting 0.123 mR/hr at 1 meter. Accordingly, he was considered safe for hospital discharge.  PLAN: The patient will return to the radiation oncology clinic for post implant CT dosimetry in three weeks.   ________________________________  Sheral Apley Tammi Klippel, M.D.

## 2021-11-03 NOTE — Anesthesia Procedure Notes (Addendum)
Procedure Name: LMA Insertion Date/Time: 11/03/2021 12:38 PM Performed by: Genelle Bal, CRNA Pre-anesthesia Checklist: Patient identified, Emergency Drugs available, Suction available and Patient being monitored Patient Re-evaluated:Patient Re-evaluated prior to induction Oxygen Delivery Method: Circle system utilized Preoxygenation: Pre-oxygenation with 100% oxygen Induction Type: IV induction Ventilation: Mask ventilation without difficulty LMA: LMA inserted LMA Size: 5.0 Number of attempts: 1 Airway Equipment and Method: Bite block Placement Confirmation: positive ETCO2 Tube secured with: Tape Dental Injury: Teeth and Oropharynx as per pre-operative assessment

## 2021-11-03 NOTE — Discharge Instructions (Signed)
No acetaminophen/Tylenol until after 4:30pm today if needed for pain.      Post Anesthesia Home Care Instructions  Activity: Get plenty of rest for the remainder of the day. A responsible individual must stay with you for 24 hours following the procedure.  For the next 24 hours, DO NOT: -Drive a car -Paediatric nurse -Drink alcoholic beverages -Take any medication unless instructed by your physician -Make any legal decisions or sign important papers.  Meals: Start with liquid foods such as gelatin or soup. Progress to regular foods as tolerated. Avoid greasy, spicy, heavy foods. If nausea and/or vomiting occur, drink only clear liquids until the nausea and/or vomiting subsides. Call your physician if vomiting continues.  Special Instructions/Symptoms: Your throat may feel dry or sore from the anesthesia or the breathing tube placed in your throat during surgery. If this causes discomfort, gargle with warm salt water. The discomfort should disappear within 24 hours.

## 2021-11-03 NOTE — H&P (Signed)
H&P  Chief Complaint: Prostate cancer  History of Present Illness: 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  He had a PSA of 14.2, normal exam with a 43 g prostate.  Biopsy was found to have Gleason 3+4 equal 7 in 2 cores about 20% and Gleason 3+3 equal 6 in 2 cores only 5% each.  He was brought today for low-dose rate permanent prostate brachytherapy seed placement which will also include cystoscopy and SpaceOAR biodegradable gel insertion.  He has been well without any dysuria or gross hematuria or fever.  He is on Xarelto and held this.  His UA is clear and PSA still in intermediate range level at 16.3.    Past Medical History:  Diagnosis Date   Atrial fibrillation [facility])    on xarelto cardiology dr Lonell Face 05-15-2021 [facility]   CKD stage 3 B    nephrology dr Belva Crome 08-01-2021 [facility]   History of asthma    childhood   Hypertension    prostate cancer    Weak urine stream 10/30/2021   Past Surgical History:  Procedure Laterality Date   NO PAST SURGERIES      Home Medications:  No medications prior to admission.   Allergies:  Allergies  Allergen Reactions   Lisinopril Cough    History reviewed. No pertinent family history. Social History:  reports that he has never smoked. He has never used smokeless tobacco. He reports current alcohol use. He reports that he does not currently use drugs.  ROS: A complete review of systems was performed.  All systems are negative except for pertinent findings as noted. Review of Systems  All other systems reviewed and are negative.   Physical Exam:  Vital signs in last 24 hours:   General:  Alert and oriented, No acute distress HEENT: Normocephalic, atraumatic Cardiovascular: Regular rate and rhythm Lungs: Regular rate and effort Abdomen: Soft, nontender, nondistended, no abdominal masses Back: No CVA tenderness Extremities: No  edema Neurologic: Grossly intact  Laboratory Data:  No results found for this or any previous visit (from the past 24 hour(s)). No results found for this or any previous visit (from the past 240 hour(s)). Creatinine: Recent Labs    11/01/21 0958  CREATININE 1.81*    Impression/Assessment/plan:  Prostate cancer-I discussed with the patient the nature, potential benefits, risks and alternatives to low-dose rate permanent brachytherapy seed implant, cystoscopy and SpaceOAR biodegradable gel insertion, including side effects of the proposed treatment, the likelihood of the patient achieving the goals of the procedure, and any potential problems that might occur during the procedure or recuperation.  We discussed risk of bladder, urethra or rectal injury with placement of the seeds or SpaceOAR gel among the other risks.  All questions answered. Patient elects to proceed.  Festus Aloe 11/03/2021

## 2021-11-03 NOTE — Anesthesia Postprocedure Evaluation (Addendum)
Anesthesia Post Note  Patient: Raymond Clayton  Procedure(s) Performed: RADIOACTIVE SEED IMPLANT/BRACHYTHERAPY IMPLANT and flexiable cystoscope (Prostate) SPACE OAR INSTILLATION (Prostate)     Patient location during evaluation: PACU Anesthesia Type: General Level of consciousness: awake and alert, oriented and patient cooperative Pain management: pain level controlled Vital Signs Assessment: post-procedure vital signs reviewed and stable Respiratory status: spontaneous breathing, nonlabored ventilation and respiratory function stable Cardiovascular status: blood pressure returned to baseline and stable Postop Assessment: no apparent nausea or vomiting Anesthetic complications: yes Comments: 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 Recommend video laryngoscopy w/ short-acting paralytic in future.   No notable events documented.  Last Vitals:  Vitals:   11/03/21 1439 11/03/21 1445  BP: 110/72 108/75  Pulse: 81   Resp:  (!) 29  Temp: 36.6 C   SpO2: 93% 97%    Last Pain:  Vitals:   11/03/21 1445  TempSrc:   PainSc: 0-No pain                 Pervis Hocking

## 2021-11-03 NOTE — Anesthesia Preprocedure Evaluation (Addendum)
Anesthesia Evaluation  Patient identified by MRN, date of birth, ID band Patient awake    Reviewed: Allergy & Precautions, NPO status , Patient's Chart, lab work & pertinent test results, reviewed documented beta blocker date and time   Airway Mallampati: II  TM Distance: >3 FB Neck ROM: Full    Dental  (+) Teeth Intact, Dental Advisory Given   Pulmonary asthma ,    Pulmonary exam normal breath sounds clear to auscultation       Cardiovascular hypertension, Pt. on medications and Pt. on home beta blockers Normal cardiovascular exam+ dysrhythmias (xarelto Last dose 10/31) Atrial Fibrillation  Rhythm:Regular Rate:Normal     Neuro/Psych negative neurological ROS  negative psych ROS   GI/Hepatic negative GI ROS, (+)     substance abuse  alcohol use,   Endo/Other  Obesity BMI 37  Renal/GU Renal InsufficiencyRenal diseaseCr 1.8, CKD 3b   Prostate ca    Musculoskeletal negative musculoskeletal ROS (+)   Abdominal (+) + obese,   Peds  Hematology negative hematology ROS (+) hct 49.1   Anesthesia Other Findings   Reproductive/Obstetrics negative OB ROS                            Anesthesia Physical Anesthesia Plan  ASA: 3  Anesthesia Plan: General   Post-op Pain Management:    Induction: Intravenous  PONV Risk Score and Plan: 3 and Ondansetron, Dexamethasone, Midazolam and Treatment may vary due to age or medical condition  Airway Management Planned: LMA  Additional Equipment: None  Intra-op Plan:   Post-operative Plan: Extubation in OR  Informed Consent: I have reviewed the patients History and Physical, chart, labs and discussed the procedure including the risks, benefits and alternatives for the proposed anesthesia with the patient or authorized representative who has indicated his/her understanding and acceptance.     Dental advisory given  Plan Discussed with:  CRNA  Anesthesia Plan Comments:        Anesthesia Quick Evaluation

## 2021-11-03 NOTE — Anesthesia Procedure Notes (Addendum)
Procedure Name: Intubation Date/Time: 11/03/2021 1:49 PM Performed by: Genelle Bal, CRNA Pre-anesthesia Checklist: Patient identified, Emergency Drugs available, Suction available and Patient being monitored Patient Re-evaluated:Patient Re-evaluated prior to induction Oxygen Delivery Method: Circle system utilized Preoxygenation: Pre-oxygenation with 100% oxygen Induction Type: IV induction Ventilation: Oral airway inserted - appropriate to patient size, Two handed mask ventilation required and Unable to mask ventilate Laryngoscope Size: Glidescope and 4 Grade View: Grade II Tube type: Oral Tube size: 7.0 mm Number of attempts: 3 Airway Equipment and Method: Stylet, Oral airway and Video-laryngoscopy Placement Confirmation: ETT inserted through vocal cords under direct vision, positive ETCO2 and breath sounds checked- equal and bilateral Secured at: 22 cm Tube secured with: Tape Dental Injury: Bloody posterior oropharynx  Difficulty Due To: Difficulty was unanticipated, Difficult Airway- due to immobile epiglottis, Difficult Airway- due to anterior larynx and Difficult Airway- due to large tongue Future Recommendations: Recommend- induction with short-acting agent, and alternative techniques readily available Comments: DLx1 Mil 2 by Alford Highland CRNA, grade IV view. glide #4 by Adeli Frost MDA, grade II view, very difficult to pass ETT through glottis d/t anterior larynx/stiff epiglottis, very large tongue, redundant airway tissue, copious secretions.  attempts x3, + BBS/etCO2.

## 2021-11-03 NOTE — Transfer of Care (Signed)
Immediate Anesthesia Transfer of Care Note  Patient: Raymond Clayton  Procedure(s) Performed: RADIOACTIVE SEED IMPLANT/BRACHYTHERAPY IMPLANT and flexiable cystoscope (Prostate) SPACE OAR INSTILLATION (Prostate)  Patient Location: PACU  Anesthesia Type:General  Level of Consciousness: drowsy  Airway & Oxygen Therapy: Patient Spontanous Breathing and Patient connected to face mask oxygen  Post-op Assessment: Report given to RN and Post -op Vital signs reviewed and stable  Post vital signs: Reviewed and stable  Last Vitals:  Vitals Value Taken Time  BP 110/72 11/03/21 1439  Temp    Pulse 88   Resp 18 11/03/21 1442  SpO2 95%   Vitals shown include unvalidated device data.  Last Pain:  Vitals:   11/03/21 1033  TempSrc: Oral  PainSc: 0-No pain      Patients Stated Pain Goal: 5 (44/61/90 1222)  Complications: No notable events documented.

## 2021-11-03 NOTE — Op Note (Signed)
Preoperative diagnosis: Stage II (T1cNxMx) Prostate cancer Postoperative diagnosis: Same   Procedure: Prostate brachytherapy seed implant, Cystoscopy, SpaceOAR biodegradable gel placement   Surgeon: Junious Silk   Radiation oncologist: Tammi Klippel   Anesthesia: Gen.   Indication for procedure: 60 year old with stage II prostate cancer who elected to proceed with prostate brachytherapy.   Findings: On fluoroscopic imaging there was adequate coverage of the prostate. On cystoscopy the urethra appeared normal, the prostatic urethra appeared normal, the trigone and ureteral orifices appeared normal with clear efflux. The bladder mucosa appeared normal. There were no stones, foreign bodies or seeds visualized in the bladder.   Dose:145 Gy   Description of procedure: After consent was obtained patient brought to the operating room. After adequate anesthesia he is placed in lithotomy position and the transrectal ultrasound probe and perineal template positioned. Catheters and brachytherapy seeds were placed per Dr. Johny Shears plan. A total of 19 catheters and 72 active sources (I-125) were placed. The anchoring needles, template and ultrasound were removed. Scout flouro imaging was obtained of the implant. The Foley was removed.  Another image was obtained.   The 18-gauge needle was then inserted approximately 1 to 2 cm anterior to the anal opening and directed under fluoroscopic guidance into the perirectal fat between the anterior rectal wall and the prostate capsule down to the mid-gland. Midline needle position was confirmed in the sagittal and axial views to verify the tip was in the perirectal fat.  Small amounts of saline were injected to hydrodissect the space between the prostate and the anterior rectal wall.  Axial imaging was viewed to confirm the needle was in the correct location in the mid gland and centered.  Aspiration confirmed no intravascular access.  The saline syringe was carefully  disconnected maintaining the desired needle position and the hydrogel was attached to the needle.  Under ultrasound guidance in the sagittal view a smooth continuous injection was done over about 12 seconds delivering the hydrogel into the space between the prostate and rectal wall.  The needle was withdrawn.  The patient was prepped again and cystoscopy was performed which was noted to be normal. He was awakened taken to the recovery room in stable condition.   Complications: None   Blood loss: Minimal   Specimens: None   Drains: None   Disposition: Patient stable to PACU -Per patient request the procedure, postop care and follow-up was discussed with Anderson Malta.

## 2021-11-06 ENCOUNTER — Encounter (HOSPITAL_BASED_OUTPATIENT_CLINIC_OR_DEPARTMENT_OTHER): Payer: Self-pay | Admitting: Urology

## 2021-11-28 ENCOUNTER — Telehealth: Payer: Self-pay | Admitting: *Deleted

## 2021-11-28 NOTE — Telephone Encounter (Signed)
CALLED PATIENT TO REMIND OF POST SEED APPTS. FOR 11-30-21- ARRIVAL TIME- 1:15 PM @ [facility], SPOKE WITH PATIENT AND HE IS AWARE OF THESE APPTS.

## 2021-11-30 ENCOUNTER — Ambulatory Visit
Admission: RE | Admit: 2021-11-30 | Discharge: 2021-11-30 | Disposition: A | Discharge status: Home / Self Care | Payer: 59 | Source: Ambulatory Visit | Attending: Urology | Admitting: Urology

## 2021-11-30 ENCOUNTER — Other Ambulatory Visit: Payer: Self-pay

## 2021-11-30 ENCOUNTER — Ambulatory Visit
Admission: RE | Admit: 2021-11-30 | Discharge: 2021-11-30 | Disposition: A | Discharge status: Home / Self Care | Payer: 59 | Source: Ambulatory Visit | Attending: Radiation Oncology | Admitting: Radiation Oncology

## 2021-11-30 ENCOUNTER — Encounter: Payer: Self-pay | Admitting: Urology

## 2021-11-30 VITALS — BP 99/57 | HR 77 | Temp 97.8°F | Resp 20 | Ht 72.0 in | Wt 280.0 lb

## 2021-11-30 DIAGNOSIS — C61 Malignant neoplasm of prostate: Secondary | ICD-10-CM | POA: Insufficient documentation

## 2021-11-30 NOTE — Progress Notes (Signed)
Patient states doing well. No symptoms reported at this time. Meaningful use complete.  I-PSS Score of 5 (mild).  Currently on Flomax 0.4mg  and urology follow-up at [facility] scheduled for January 2023 -per patient.  BP (!) 99/57 (BP Location: Right Arm, Patient Position: Sitting, Cuff Size: Large)   Pulse 77   Temp 97.8 F (36.6 C) (Temporal)   Resp 20   Ht 6' (1.829 m)   Wt 280 lb (127 kg)   SpO2 99%   BMI 37.97 kg/m

## 2021-11-30 NOTE — Progress Notes (Addendum)
Radiation Oncology         [phone] ________________________________  Name: Raymond Clayton MRN: [id number]  Date: 11/30/2021  DOB: May 03, 1961  Post-Seed Follow-Up Visit Note  CC: Bernerd Limbo, MD  Festus Aloe, MD  Diagnosis:   60 y.o. gentleman with Stage T1c adenocarcinoma of the prostate with Gleason score of 3+4, and PSA of 14.2.    ICD-10-CM   1. Malignant neoplasm of prostate ([place])  C61       Interval Since Last Radiation:  4 weeks 11/03/21:  Insertion of radioactive I-125 seeds into the prostate gland; 145 Gy, definitive therapy with placement of SpaceOAR gel.  Narrative:  The patient returns today for routine follow-up.  He is complaining of increased urinary frequency and urinary hesitation symptoms. He filled out a questionnaire regarding urinary function today providing and overall IPSS score of 5 characterizing his symptoms as mild.  His pre-implant score was 8.  He specifically denies dysuria, gross hematuria, excessive daytime frequency, urgency, straining to void, incomplete bladder emptying or incontinence.  He reports a healthy appetite and is maintaining his weight.  He denies any abdominal pain or bowel symptoms.  He has not noticed any significant impact on his energy level and overall, he is quite pleased with his progress to date.  ALLERGIES:  is allergic to lisinopril.  Meds: Current Outpatient Medications  Medication Sig Dispense Refill   amLODipine (NORVASC) 10 MG tablet Take 10 mg by mouth daily.     atorvastatin (LIPITOR) 10 MG tablet Take 10 mg by mouth daily.     cephALEXin (KEFLEX) 500 MG capsule Take 1 capsule (500 mg total) by mouth 3 (three) times daily. 9 capsule 0   Cholecalciferol (VITAMIN D3) 10 MCG (400 UNIT) tablet Take 400 Units by mouth daily. (Patient not taking: Reported on 10/30/2021)     docusate sodium (COLACE) 100 MG capsule Take 1 capsule (100 mg total) by mouth 2 (two) times daily. 10 capsule 0    losartan-hydrochlorothiazide (HYZAAR) 50-12.5 MG tablet Take 1 tablet by mouth daily.     metoprolol tartrate (LOPRESSOR) 100 MG tablet Take 100 mg by mouth in the morning and at bedtime.     oxyCODONE-acetaminophen (PERCOCET) 7.5-325 MG tablet Take 1 tablet by mouth every 4 (four) hours as needed for severe pain. 15 tablet 0   Rivaroxaban (XARELTO) 15 MG TABS tablet Take 1 tablet (15 mg total) by mouth daily with supper. 42 tablet    tamsulosin (FLOMAX) 0.4 MG CAPS capsule Take 1 capsule (0.4 mg total) by mouth daily after supper. 30 capsule 0   No current facility-administered medications for this visit.    Physical Findings: In general this is a well appearing African-American male in no acute distress. He's alert and oriented x4 and appropriate throughout the examination. Cardiopulmonary assessment is negative for acute distress and he exhibits normal effort.   Lab Findings: Lab Results  Component Value Date   WBC 6.5 11/01/2021   HGB 16.2 11/01/2021   HCT 49.1 11/01/2021   MCV 87.1 11/01/2021   PLT 234 11/01/2021    Radiographic Findings:  Patient underwent CT imaging in our clinic for post implant dosimetry. The CT will be reviewed by Dr. Tammi Klippel to confirm there is an adequate distribution of radioactive seeds throughout the prostate gland and ensure that there are no seeds in or near the rectum.  We suspect the final radiation plan and dosimetry will show appropriate coverage of the prostate gland. He understands that we will call and  inform him of any unexpected findings on further review of his imaging and dosimetry.  Impression/Plan: The patient is recovering from the effects of radiation. His urinary symptoms should gradually improve over the next 4-6 months. We talked about this today. He is encouraged by his improvement already and is otherwise pleased with his outcome. We also talked about long-term follow-up for prostate cancer following seed implant. He understands that  ongoing PSA determinations and digital rectal exams will help perform surveillance to rule out disease recurrence. He was seen by Jiles Crocker, NP last week and has a follow up appointment scheduled with Dr. Junious Silk on 02/07/2022. He understands what to expect with his PSA measures. Patient was also educated today about some of the long-term effects from radiation including a small risk for rectal bleeding and possibly erectile dysfunction. We talked about some of the general management approaches to these potential complications. However, I did encourage the patient to contact our office or return at any point if he has questions or concerns related to his previous radiation and prostate cancer.    Nicholos Johns, PA-C

## 2021-11-30 NOTE — Progress Notes (Signed)
  Radiation Oncology         [phone]) [phone] ________________________________  Name: Raymond Clayton MRN: [id number]  Date: 11/30/2021  DOB: [birth year]  COMPLEX SIMULATION NOTE  NARRATIVE:  The patient was brought to the [facility] today following prostate seed implantation approximately one month ago.  Identity was confirmed.  All relevant records and images related to the planned course of therapy were reviewed.  Then, the patient was set-up supine.  CT images were obtained.  The CT images were loaded into the planning software.  Then the prostate and rectum were contoured.  Treatment planning then occurred.  The implanted iodine 125 seeds were identified by the physics staff for projection of radiation distribution  I have requested : 3D Simulation  I have requested a DVH of the following structures: Prostate and rectum.    ________________________________  Sheral Apley Tammi Klippel, M.D.

## 2021-12-03 ENCOUNTER — Emergency Department (HOSPITAL_COMMUNITY)
Admission: EM | Admit: 2021-12-03 | Discharge: 2021-12-04 | Disposition: A | Discharge status: Home / Self Care | Payer: 59 | Attending: Emergency Medicine | Admitting: Emergency Medicine

## 2021-12-03 ENCOUNTER — Other Ambulatory Visit: Payer: Self-pay

## 2021-12-03 ENCOUNTER — Emergency Department (HOSPITAL_COMMUNITY): Payer: 59

## 2021-12-03 ENCOUNTER — Encounter (HOSPITAL_COMMUNITY): Payer: Self-pay

## 2021-12-03 DIAGNOSIS — Z8546 Personal history of malignant neoplasm of prostate: Secondary | ICD-10-CM | POA: Diagnosis not present

## 2021-12-03 DIAGNOSIS — Z7901 Long term (current) use of anticoagulants: Secondary | ICD-10-CM | POA: Insufficient documentation

## 2021-12-03 DIAGNOSIS — R55 Syncope and collapse: Secondary | ICD-10-CM | POA: Insufficient documentation

## 2021-12-03 DIAGNOSIS — I4891 Unspecified atrial fibrillation: Secondary | ICD-10-CM | POA: Insufficient documentation

## 2021-12-03 DIAGNOSIS — S2241XA Multiple fractures of ribs, right side, initial encounter for closed fracture: Secondary | ICD-10-CM | POA: Insufficient documentation

## 2021-12-03 DIAGNOSIS — M549 Dorsalgia, unspecified: Secondary | ICD-10-CM | POA: Diagnosis not present

## 2021-12-03 DIAGNOSIS — Z79899 Other long term (current) drug therapy: Secondary | ICD-10-CM | POA: Diagnosis not present

## 2021-12-03 DIAGNOSIS — I129 Hypertensive chronic kidney disease with stage 1 through stage 4 chronic kidney disease, or unspecified chronic kidney disease: Secondary | ICD-10-CM | POA: Diagnosis not present

## 2021-12-03 DIAGNOSIS — W1830XA Fall on same level, unspecified, initial encounter: Secondary | ICD-10-CM | POA: Diagnosis not present

## 2021-12-03 DIAGNOSIS — N1832 Chronic kidney disease, stage 3b: Secondary | ICD-10-CM | POA: Insufficient documentation

## 2021-12-03 DIAGNOSIS — Y92009 Unspecified place in unspecified non-institutional (private) residence as the place of occurrence of the external cause: Secondary | ICD-10-CM | POA: Diagnosis not present

## 2021-12-03 DIAGNOSIS — S299XXA Unspecified injury of thorax, initial encounter: Secondary | ICD-10-CM | POA: Diagnosis present

## 2021-12-03 NOTE — ED Triage Notes (Signed)
Pt complains of right back pain, right shoulder pain, and right sided pain since Friday after a fall.

## 2021-12-03 NOTE — ED Provider Notes (Addendum)
Emergency Medicine Provider Triage Evaluation Note  Raymond Clayton , a 60 y.o. male  was evaluated in triage.  Pt complains of fall.  Had fall on Friday.  He denies any syncope.  He has been ambulatory since.  Denies hitting his head, LOC.  He has pain to right shoulder, right posterior ribs, midline thoracic, lumbar pain.  No paresthesias or weakness.  No headache, vision changes, chest pain, abdominal pain. On Xarelto  Review of Systems  Positive: Fall, extremity pain Negative: Headache, numbness, weakness, vomiting  Physical Exam  BP 113/81 (BP Location: Left Arm)   Pulse 90   Temp 98 F (36.7 C) (Oral)   Resp 16   SpO2 98%  Gen:   Awake, no distress   Resp:  Normal effort  Chest:  Tenderness to right posterior ribs MSK:   Moves extremities without difficulty, tenderness to right shoulder, midline thoracic, lumbar region.  Does have some mild tenderness to right lower ribs ABD:  Soft, nontender Other:  Quarter sized area of ecchymosis right posterior mid rib  Medical Decision Making  Medically screening exam initiated at 9:23 PM.  Appropriate orders placed.  Marylou Mccoy was informed that the remainder of the evaluation will be completed by another provider, this initial triage assessment does not replace that evaluation, and the importance of remaining in the ED until their evaluation is complete.  Fall, right-sided pain.  Does have some tenderness to right lower ribs over abdomen soft, nontender, negative Murphy sign.  Low suspicion for acute intra-abdominal etiology.  Currently anticoagulated however denies hitting head       Armando Lauman A, PA-C 12/03/21 2156    Varney Biles, MD 12/04/21 [phone]

## 2021-12-04 LAB — CBC WITH DIFFERENTIAL/PLATELET
Abs Immature Granulocytes: 0.02 10*3/uL (ref 0.00–0.07)
Basophils Absolute: 0 10*3/uL (ref 0.0–0.1)
Basophils Relative: 1 %
Eosinophils Absolute: 0.1 10*3/uL (ref 0.0–0.5)
Eosinophils Relative: 1 %
HCT: 44.6 % (ref 39.0–52.0)
Hemoglobin: 14.8 g/dL (ref 13.0–17.0)
Immature Granulocytes: 0 %
Lymphocytes Relative: 18 %
Lymphs Abs: 1.2 10*3/uL (ref 0.7–4.0)
MCH: 28.8 pg (ref 26.0–34.0)
MCHC: 33.2 g/dL (ref 30.0–36.0)
MCV: 86.8 fL (ref 80.0–100.0)
Monocytes Absolute: 0.9 10*3/uL (ref 0.1–1.0)
Monocytes Relative: 13 %
Neutro Abs: 4.7 10*3/uL (ref 1.7–7.7)
Neutrophils Relative %: 67 %
Platelets: 238 10*3/uL (ref 150–400)
RBC: 5.14 MIL/uL (ref 4.22–5.81)
RDW: 13.8 % (ref 11.5–15.5)
WBC: 7 10*3/uL (ref 4.0–10.5)
nRBC: 0 % (ref 0.0–0.2)

## 2021-12-04 LAB — BASIC METABOLIC PANEL
Anion gap: 9 (ref 5–15)
BUN: 24 mg/dL — ABNORMAL HIGH (ref 8–23)
CO2: 25 mmol/L (ref 22–32)
Calcium: 9.3 mg/dL (ref 8.9–10.3)
Chloride: 99 mmol/L (ref 98–111)
Creatinine, Ser: 2.11 mg/dL — ABNORMAL HIGH (ref 0.61–1.24)
GFR, Estimated: 35 mL/min — ABNORMAL LOW (ref >60)
Glucose, Bld: 97 mg/dL (ref 70–99)
Potassium: 3.6 mmol/L (ref 3.5–5.1)
Sodium: 133 mmol/L — ABNORMAL LOW (ref 135–145)

## 2021-12-04 MED ORDER — HYDROCODONE-ACETAMINOPHEN 5-325 MG PO TABS
1.0000 | ORAL_TABLET | Freq: Once | ORAL | Status: AC
  Administered 2021-12-04: 1 via ORAL
  Filled 2021-12-04: qty 1

## 2021-12-04 MED ORDER — HYDROCODONE-ACETAMINOPHEN 5-325 MG PO TABS: 1.0000 | ORAL_TABLET | ORAL | 0 refills | Status: AC | PRN

## 2021-12-04 NOTE — ED Provider Notes (Signed)
[facility] DEPT Provider Note   CSN: [id number] Arrival date & time: 12/03/21  2105     History Chief Complaint  Patient presents with   Back Pain   Shoulder Pain    Raymond Clayton is a 60 y.o. male.  The history is provided by the patient and medical records.  Back Pain Associated symptoms: chest pain (right rib pain)   Shoulder Pain Associated symptoms: back pain    60 year old male with history of A. fib on Xarelto, chronic kidney disease, hypertension, prostate cancer followed by urology status post seed implant, presenting to the ED after a fall.  Patient states this occurred on Friday.  States he went to a friend's house as they were having a plate sale.  He states he went to go step off the porch and feels like he "blacked out for a second" but never lost consciousness.  States he fell onto the ground on his right side.  No head injury.  He states he remained awake, friend helped him up and he was able to get in the car and drive home.  He does admit he did not eat that day prior to episode and had doctors appt the day before and was told his BP was low in the 90's and that could lead to dizziness/passing out.  States he has not had any further dizziness or feelings of syncope since he fell but has continued to have pain in his right ribs and back so came in today.    Past Medical History:  Diagnosis Date   Atrial fibrillation [facility])    on xarelto cardiology dr Lonell Face 05-15-2021 [facility]   CKD stage 3 B    nephrology dr Belva Crome 08-01-2021 [facility]   Complication of anesthesia    Difficult intubation    History of asthma    childhood   Hypertension    prostate cancer    Weak urine stream 10/30/2021    Patient Active Problem List   Diagnosis Date Noted   Difficult intubation 11/03/2021   Malignant neoplasm of prostate ([place]) 08/03/2021    Past Surgical History:  Procedure Laterality Date   NO  PAST SURGERIES     RADIOACTIVE SEED IMPLANT N/A 11/03/2021   Procedure: RADIOACTIVE SEED IMPLANT/BRACHYTHERAPY IMPLANT and flexiable cystoscope;  Surgeon: Festus Aloe, MD;  Location: [facility];  Service: Urology;  Laterality: N/A;   SPACE OAR INSTILLATION N/A 11/03/2021   Procedure: SPACE OAR INSTILLATION;  Surgeon: Festus Aloe, MD;  Location: [facility];  Service: Urology;  Laterality: N/A;       History reviewed. No pertinent family history.  Social History   Tobacco Use   Smoking status: Never   Smokeless tobacco: Never  Vaping Use   Vaping Use: Never used  Substance Use Topics   Alcohol use: Yes    Comment: occ   Drug use: Not Currently    Home Medications Prior to Admission medications   Medication Sig Start Date End Date Taking? Authorizing Provider  amLODipine (NORVASC) 10 MG tablet Take 10 mg by mouth daily. 02/16/21   [provider]  atorvastatin (LIPITOR) 10 MG tablet Take 10 mg by mouth daily. 01/19/21   [provider]  cephALEXin (KEFLEX) 500 MG capsule Take 1 capsule (500 mg total) by mouth 3 (three) times daily. 11/03/21   Festus Aloe, MD  Cholecalciferol (VITAMIN D3) 10 MCG (400 UNIT) tablet Take 400 Units by mouth daily. Patient  not taking: Reported on 10/30/2021    [provider]  docusate sodium (COLACE) 100 MG capsule Take 1 capsule (100 mg total) by mouth 2 (two) times daily. 11/03/21   Festus Aloe, MD  losartan-hydrochlorothiazide (HYZAAR) 50-12.5 MG tablet Take 1 tablet by mouth daily. 02/27/21   [provider]  metoprolol tartrate (LOPRESSOR) 100 MG tablet Take 100 mg by mouth in the morning and at bedtime. 03/27/21   [provider]  oxyCODONE-acetaminophen (PERCOCET) 7.5-325 MG tablet Take 1 tablet by mouth every 4 (four) hours as needed for severe pain. 11/03/21   Festus Aloe, MD  Rivaroxaban (XARELTO) 15 MG TABS tablet Take 1 tablet (15 mg total) by  mouth daily with supper. 11/05/21   Festus Aloe, MD  tamsulosin (FLOMAX) 0.4 MG CAPS capsule Take 1 capsule (0.4 mg total) by mouth daily after supper. 11/03/21   Festus Aloe, MD    Allergies    Lisinopril  Review of Systems   Review of Systems  Cardiovascular:  Positive for chest pain (right rib pain).  Musculoskeletal:  Positive for back pain.  All other systems reviewed and are negative.  Physical Exam Updated Vital Signs BP 113/81 (BP Location: Left Arm)   Pulse 90   Temp 98 F (36.7 C) (Oral)   Resp 16   SpO2 98%   Physical Exam Vitals and nursing note reviewed.  Constitutional:      Appearance: He is well-developed.  HENT:     Head: Normocephalic and atraumatic.     Comments: No visible head trauma Eyes:     Conjunctiva/sclera: Conjunctivae normal.     Pupils: Pupils are equal, round, and reactive to light.     Comments: PERRL  Cardiovascular:     Rate and Rhythm: Normal rate and regular rhythm.     Heart sounds: Normal heart sounds.  Pulmonary:     Effort: Pulmonary effort is normal.     Breath sounds: Normal breath sounds.     Comments: Lungs CTAB, no apparent splinting or SOB, O2 98% on RA Chest:     Comments: Right mid-lower ribs are TTP anteriorly and posteriorly, there is no bruising or gross deformity on exam Abdominal:     General: Bowel sounds are normal.     Palpations: Abdomen is soft.  Musculoskeletal:        General: Normal range of motion.     Cervical back: Normal range of motion.  Skin:    General: Skin is warm and dry.  Neurological:     Mental Status: He is alert and oriented to person, place, and time.     Comments: AAOx3, answering questions and following commands appropriately; equal strength UE and LE bilaterally; CN grossly intact; moves all extremities appropriately without ataxia; no focal neuro deficits or facial asymmetry appreciated    ED Results / Procedures / Treatments   Labs (all labs ordered are listed, but only  abnormal results are displayed) Labs Reviewed  BASIC METABOLIC PANEL - Abnormal; Notable for the following components:      Result Value   Sodium 133 (*)    BUN 24 (*)    Creatinine, Ser 2.11 (*)    GFR, Estimated 35 (*)    All other components within normal limits  CBC WITH DIFFERENTIAL/PLATELET  URINALYSIS, ROUTINE W REFLEX MICROSCOPIC    EKG None  Radiology DG Ribs Unilateral W/Chest Right  Result Date: 12/03/2021 CLINICAL DATA:  Status post fall. EXAM: RIGHT RIBS AND CHEST - 3+ VIEW COMPARISON:  Chest x-ray 10/05/2021 FINDINGS: Acute displaced posterolateral ninth and eighth rib fractures. The ninth rib is also fractured posteriorly where it is nondisplaced. There is no evidence of significant pneumothorax bilaterally. Trace left pleural effusion not excluded. No right pleural effusion. Both lungs are clear. Heart size and mediastinal contours are within normal limits. Aortic calcification. IMPRESSION: 1. Acute displaced posterolateral ninth and eighth rib fractures. The ninth rib is also fractured posteriorly but is nondisplaced. 2. No radiographic findings of an associated pneumothorax or pleural fluid on the right. 3. Trace left pleural effusion not excluded. Electronically Signed   By: Iven Finn M.D.   On: 12/03/2021 22:09   DG Thoracic Spine 2 View  Result Date: 12/03/2021 CLINICAL DATA:  Fall 3 days ago EXAM: THORACIC SPINE 2 VIEWS COMPARISON:  None. FINDINGS: There is no evidence of thoracic spine fracture. Alignment is normal. No other significant bone abnormalities are identified. Flowing ventral thoracic osteophytes. IMPRESSION: No acute fracture or listhesis of the thoracic spine. Electronically Signed   By: Ulyses Jarred M.D.   On: 12/03/2021 22:06   DG Lumbar Spine Complete  Result Date: 12/03/2021 CLINICAL DATA:  Fall EXAM: LUMBAR SPINE - COMPLETE 4+ VIEW COMPARISON:  None. FINDINGS: There is no evidence of lumbar spine fracture. Alignment is normal. Intervertebral  disc spaces are maintained. IMPRESSION: Negative. Electronically Signed   By: Ulyses Jarred M.D.   On: 12/03/2021 22:14   DG Shoulder Right  Result Date: 12/03/2021 CLINICAL DATA:  [place] 3 days ago with continued right shoulder pain. EXAM: RIGHT SHOULDER - 2+ VIEW COMPARISON:  None. FINDINGS: Normal bone mineralization. There is no evidence of shoulder fracture or dislocation. There are inferior osteophytes at the [facility] joint and mild inferior spurring at the glenohumeral joint, slight spurring along the greater tuberosity with preservation of the normal acromiohumeral space. Visualized right lung fields appear clear. There are displaced fractures incidentally noted of the posterolateral right ninth and tenth ribs. IMPRESSION: 1. Degenerative spurs, without evidence of shoulder fractures. 2. Displaced fractures of the posterolateral right ninth and tenth ribs, and there appears to be a nondisplaced fracture of the posteromedial right tenth rib as well with an intervening flail segment. I do not see a pneumothorax. Electronically Signed   By: Telford Nab M.D.   On: 12/03/2021 22:08    Procedures Procedures   Medications Ordered in ED Medications  HYDROcodone-acetaminophen (NORCO/VICODIN) 5-325 MG per tablet 1 tablet (has no administration in time range)    ED Course  I have reviewed the triage vital signs and the nursing notes.  Pertinent labs & imaging results that were available during my care of the patient were reviewed by me and considered in my medical decision making (see chart for details).    MDM Rules/Calculators/A&P                           60 y.o. M here with syncope that occurred 2 days ago.  States he had not eaten that day and was noted to have hypotension at PCP the day prior.  Fell onto right side, no LOC.  He is awake, alert, appropriately oriented here.  Does have pain of right anterior and posterior mid/lower ribs.  There is no acute deformity or bruising on exam.  He has  no visible signs of head trauma.  He did have x-rays obtained from triage revealing right ninth and 10th rib fractures.  There is no pneumothorax or other complicating features.  He is maintaining good saturations on room air.  Given reported syncope, will check basic labs and EKG.  2:23 AM EKG with A. fib, this is chronic.  Labs as above-- SrCr appears at baseline given his CKD (ranges from 18.8- 2.3 based on prior values from PCP at [facility]).  He remains without any recurrent feelings of syncope here.  BP has remained stable.  He is not having any signs of respiratory distress and is maintained good saturations on RA.  Syncope may be related to remote hypotension and not eating.   As this occurred 2 days ago without recurrence, feel he is stable for d/c home.  Given pain medication and incentive spirometer, instructed to use spirometer a few times a day.  Will have him follow-up with PCP.   Return here for new concerns.  Final Clinical Impression(s) / ED Diagnoses Final diagnoses:  Closed fracture of multiple ribs of right side, initial encounter  Syncope, unspecified syncope type    Rx / DC Orders ED Discharge Orders          Ordered    HYDROcodone-acetaminophen (NORCO/VICODIN) 5-325 MG tablet  Every 4 hours PRN        12/04/21 0230             Larene Pickett, PA-C 12/04/21 0520    Quintella Reichert, MD 12/04/21 [phone]

## 2021-12-04 NOTE — Discharge Instructions (Signed)
Make sure to eat and drink regularly. Can take pain medication as needed-- may reserve for night time use if desired.  Do not mix with alcohol. Make sure to use incentive spirometer several times a day to keep lungs strong. Follow-up with your primary care doctor. Return here for new concerns.

## 2021-12-04 NOTE — ED Notes (Signed)
Patient states unable to urinate at this time. Patient provided with water and states he will attempt soon

## 2021-12-06 ENCOUNTER — Encounter: Payer: Self-pay | Admitting: Radiation Oncology

## 2021-12-06 DIAGNOSIS — C61 Malignant neoplasm of prostate: Secondary | ICD-10-CM | POA: Diagnosis not present

## 2022-01-01 NOTE — Progress Notes (Signed)
°  Radiation Oncology         [phone] ________________________________  Name: Raymond Clayton MRN: [id number]  Date: 12/06/2021  DOB: April 01, 1961  3D Planning Note   Prostate Brachytherapy Post-Implant Dosimetry  Diagnosis: 61 y.o. gentleman with Stage T1c adenocarcinoma of the prostate with Gleason score of 3+4, and PSA of 14.2.  Narrative: On a previous date, Raymond Clayton returned following prostate seed implantation for post implant planning. He underwent CT scan complex simulation to delineate the three-dimensional structures of the pelvis and demonstrate the radiation distribution.  Since that time, the seed localization, and complex isodose planning with dose volume histograms have now been completed.  Results:   Prostate Coverage - The dose of radiation delivered to the 90% or more of the prostate gland (D90) was 121.04% of the prescription dose. This exceeds our goal of greater than 90%. Rectal Sparing - The volume of rectal tissue receiving the prescription dose or higher was 0.0 cc. This falls under our thresholds tolerance of 1.0 cc.  Impression: The prostate seed implant appears to show adequate target coverage and appropriate rectal sparing.  Plan:  The patient will continue to follow with urology for ongoing PSA determinations. I would anticipate a high likelihood for local tumor control with minimal risk for rectal morbidity.  ________________________________  Sheral Apley Tammi Klippel, M.D.

## 2022-01-17 ENCOUNTER — Telehealth: Payer: Self-pay | Admitting: Adult Health

## 2022-01-17 NOTE — Telephone Encounter (Signed)
Called and left message on machine for patient to discuss survivorship care plan visit.  Callback number given  Wilber Bihari, NP 01/17/22 2:51 PM Medical Oncology and Hematology [facility] [place],  [address] Tel. [phone]    Fax. [phone]

## 2022-01-22 ENCOUNTER — Telehealth: Payer: Self-pay | Admitting: *Deleted

## 2022-01-25 ENCOUNTER — Telehealth: Payer: Self-pay | Admitting: *Deleted

## 2022-08-02 IMAGING — CR DG LUMBAR SPINE COMPLETE 4+V
5 series · 5 of 5 positions shown · non-contrast
Comparison: None.

CLINICAL DATA: Fall

EXAM:
LUMBAR SPINE - COMPLETE 4+ VIEW

[t lumbar spine ap]
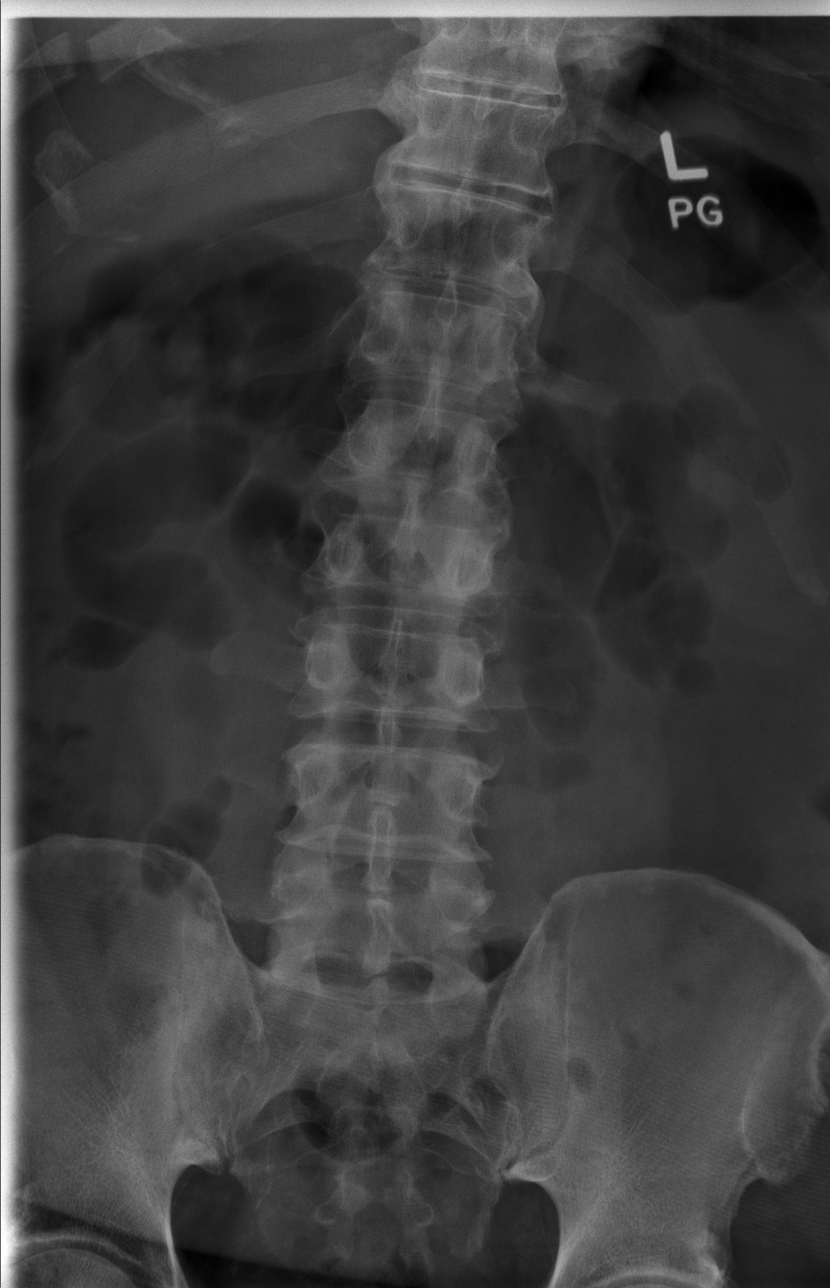

[t lumbar spine obl (1 of 2)]
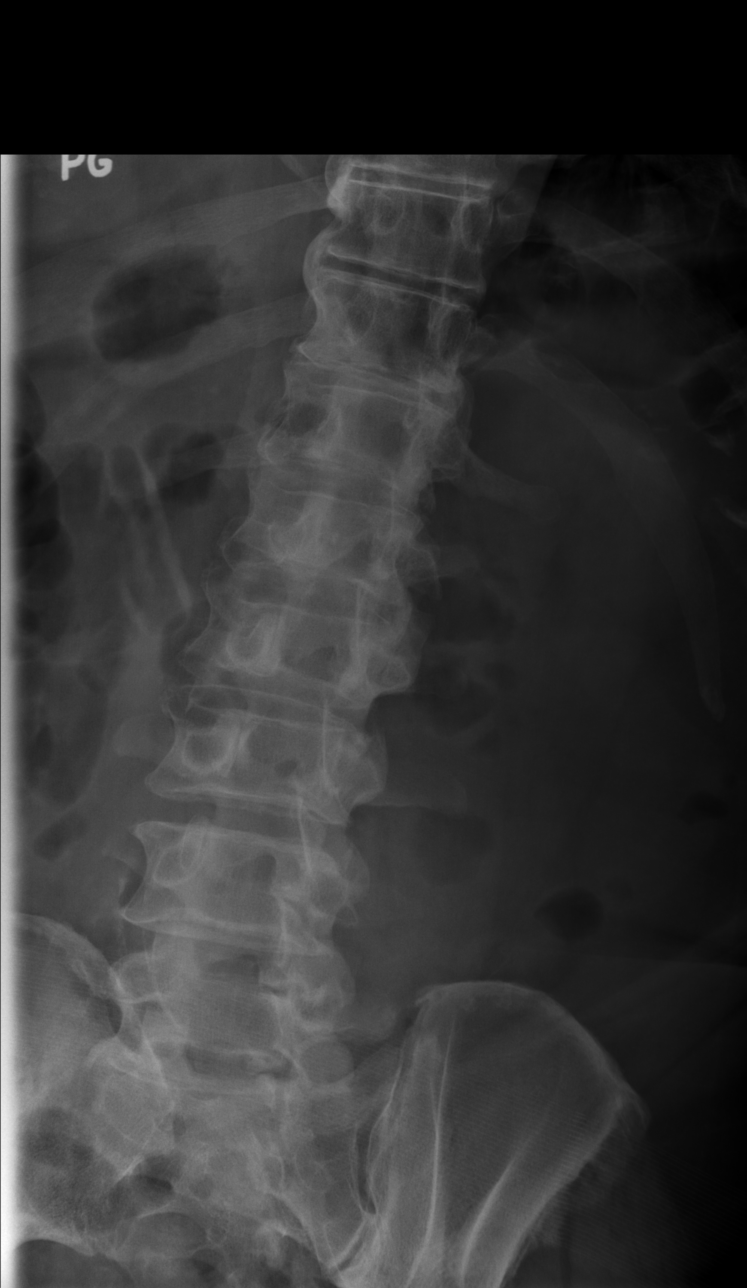

[t lumbar spine obl (2 of 2)]
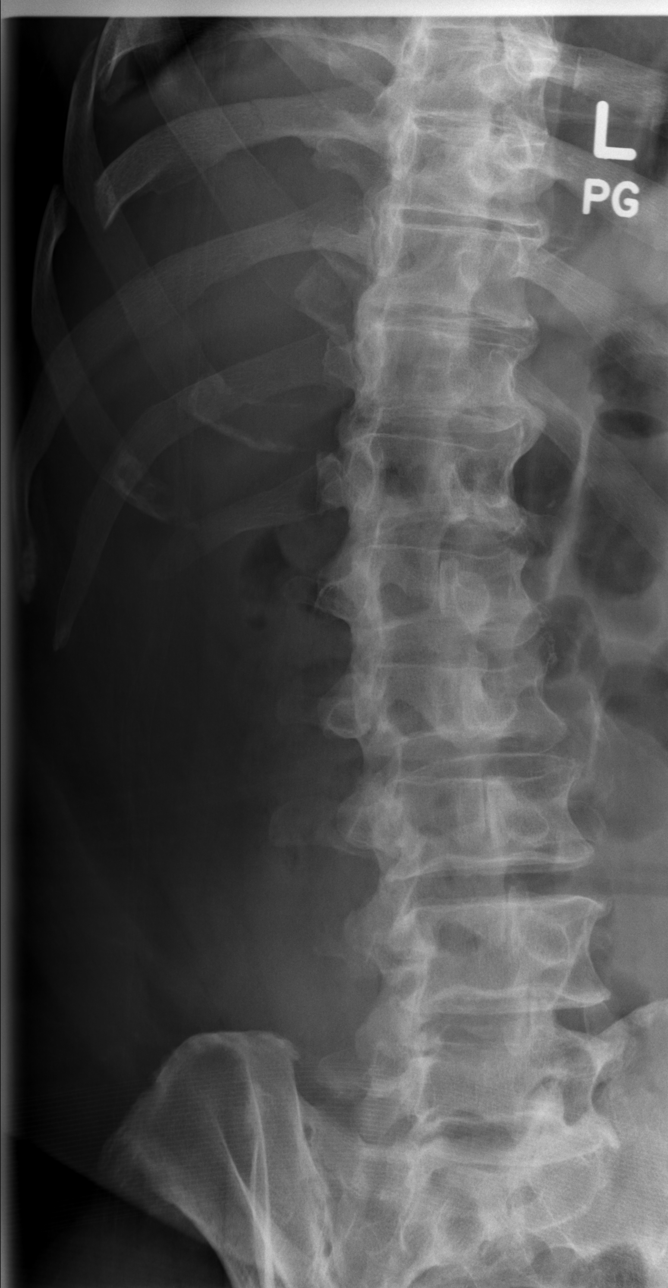

[t lumbar spine lat]
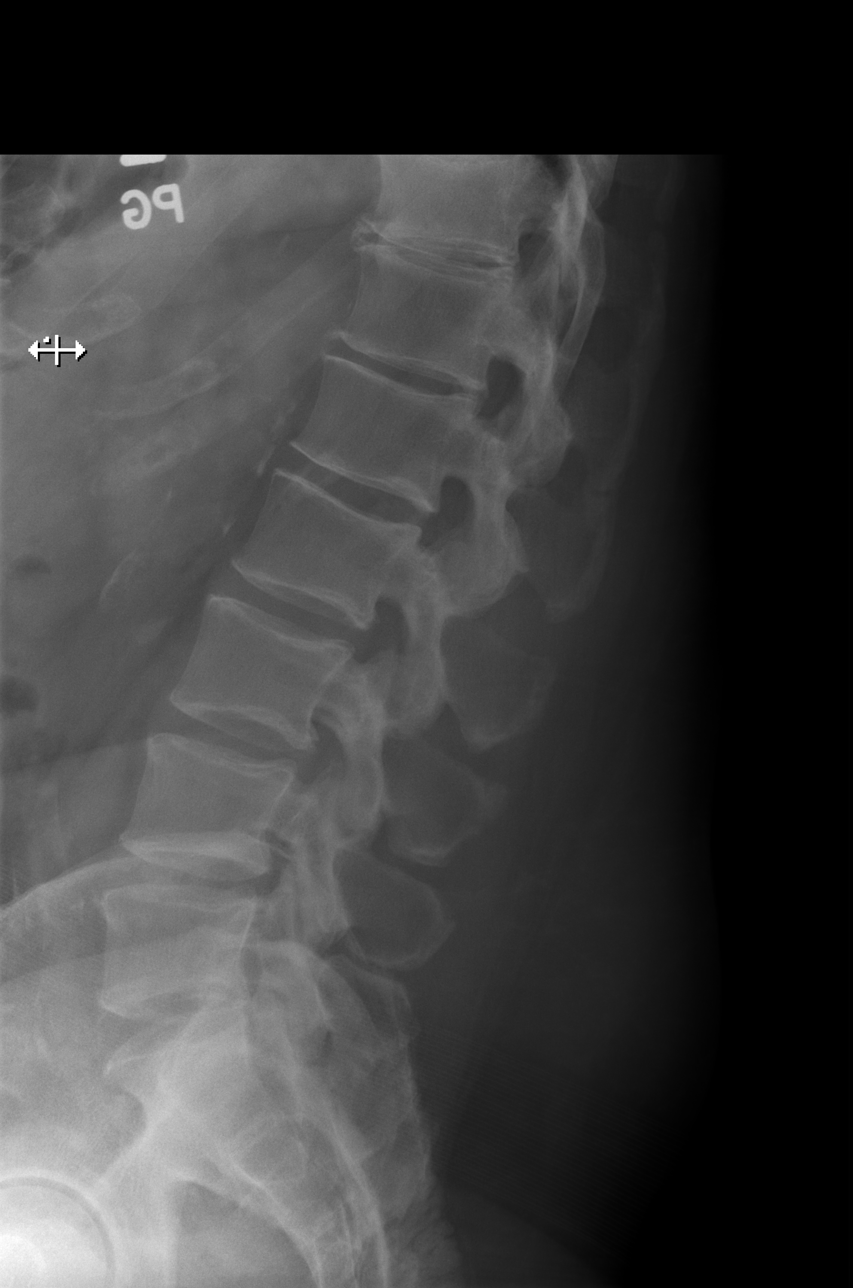

[t lumbar l-5 s-1 spot]
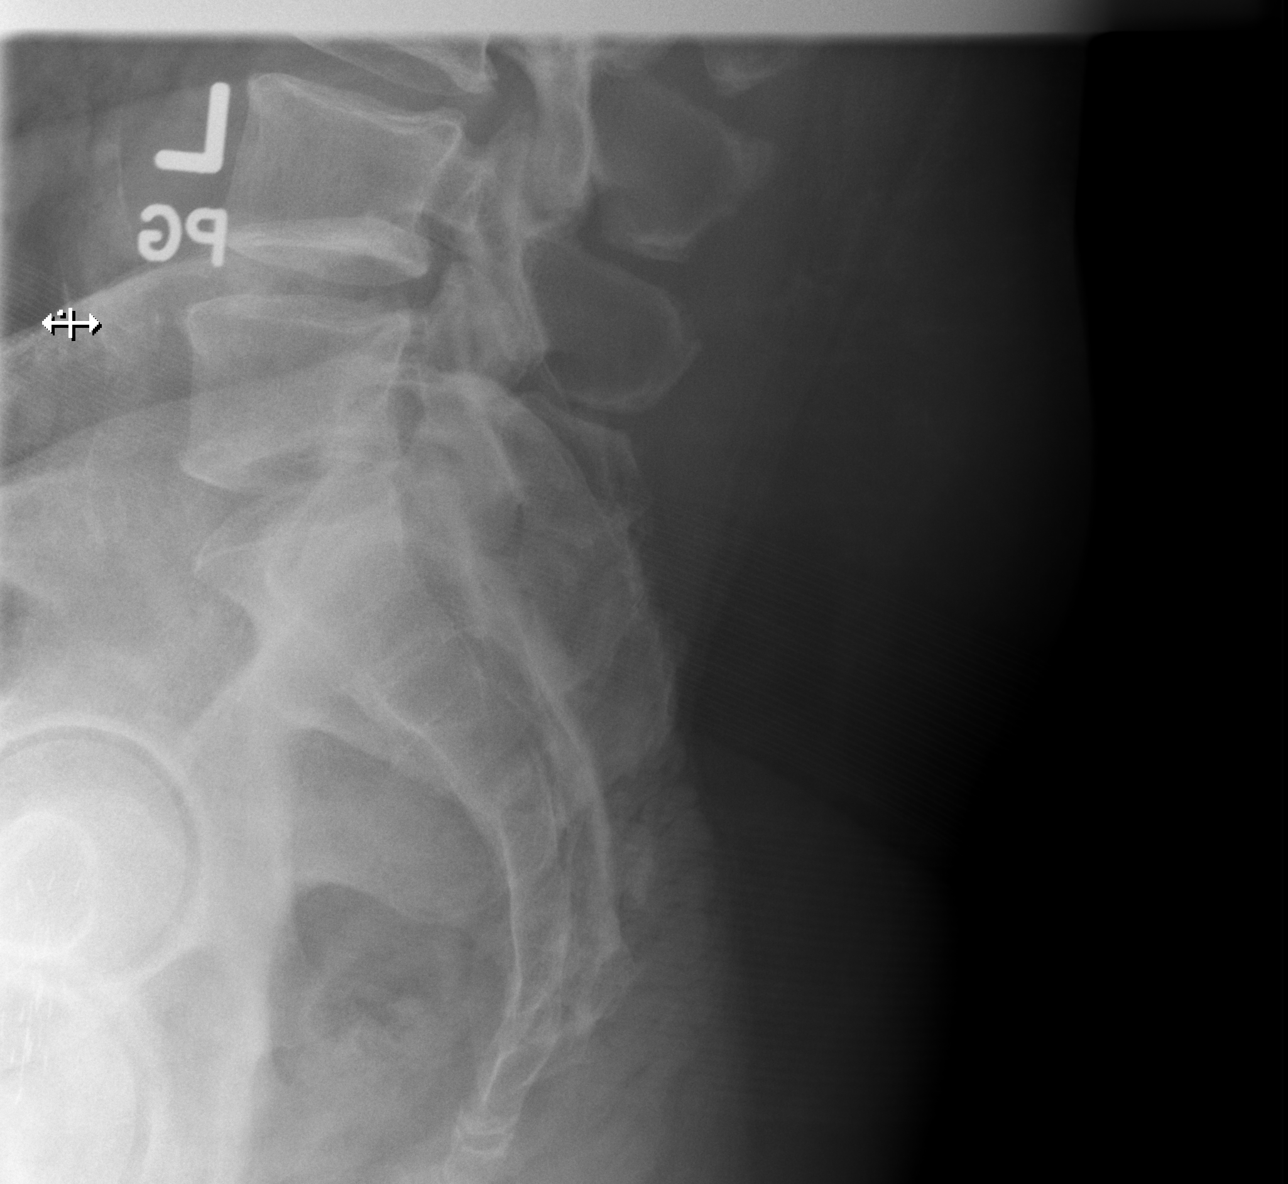

[5 of 5 positions shown; findings below may reference images not displayed]

FINDINGS: There is no evidence of lumbar spine fracture. Alignment is normal.
Intervertebral disc spaces are maintained.
IMPRESSION: Negative.

## 2022-08-02 IMAGING — CR DG SHOULDER 2+V*R*
3 series · 3 of 3 positions shown · non-contrast
Comparison: None.

CLINICAL DATA: Fell 3 days ago with continued right shoulder pain.

EXAM:
RIGHT SHOULDER - 2+ VIEW

[w shoulder external right]
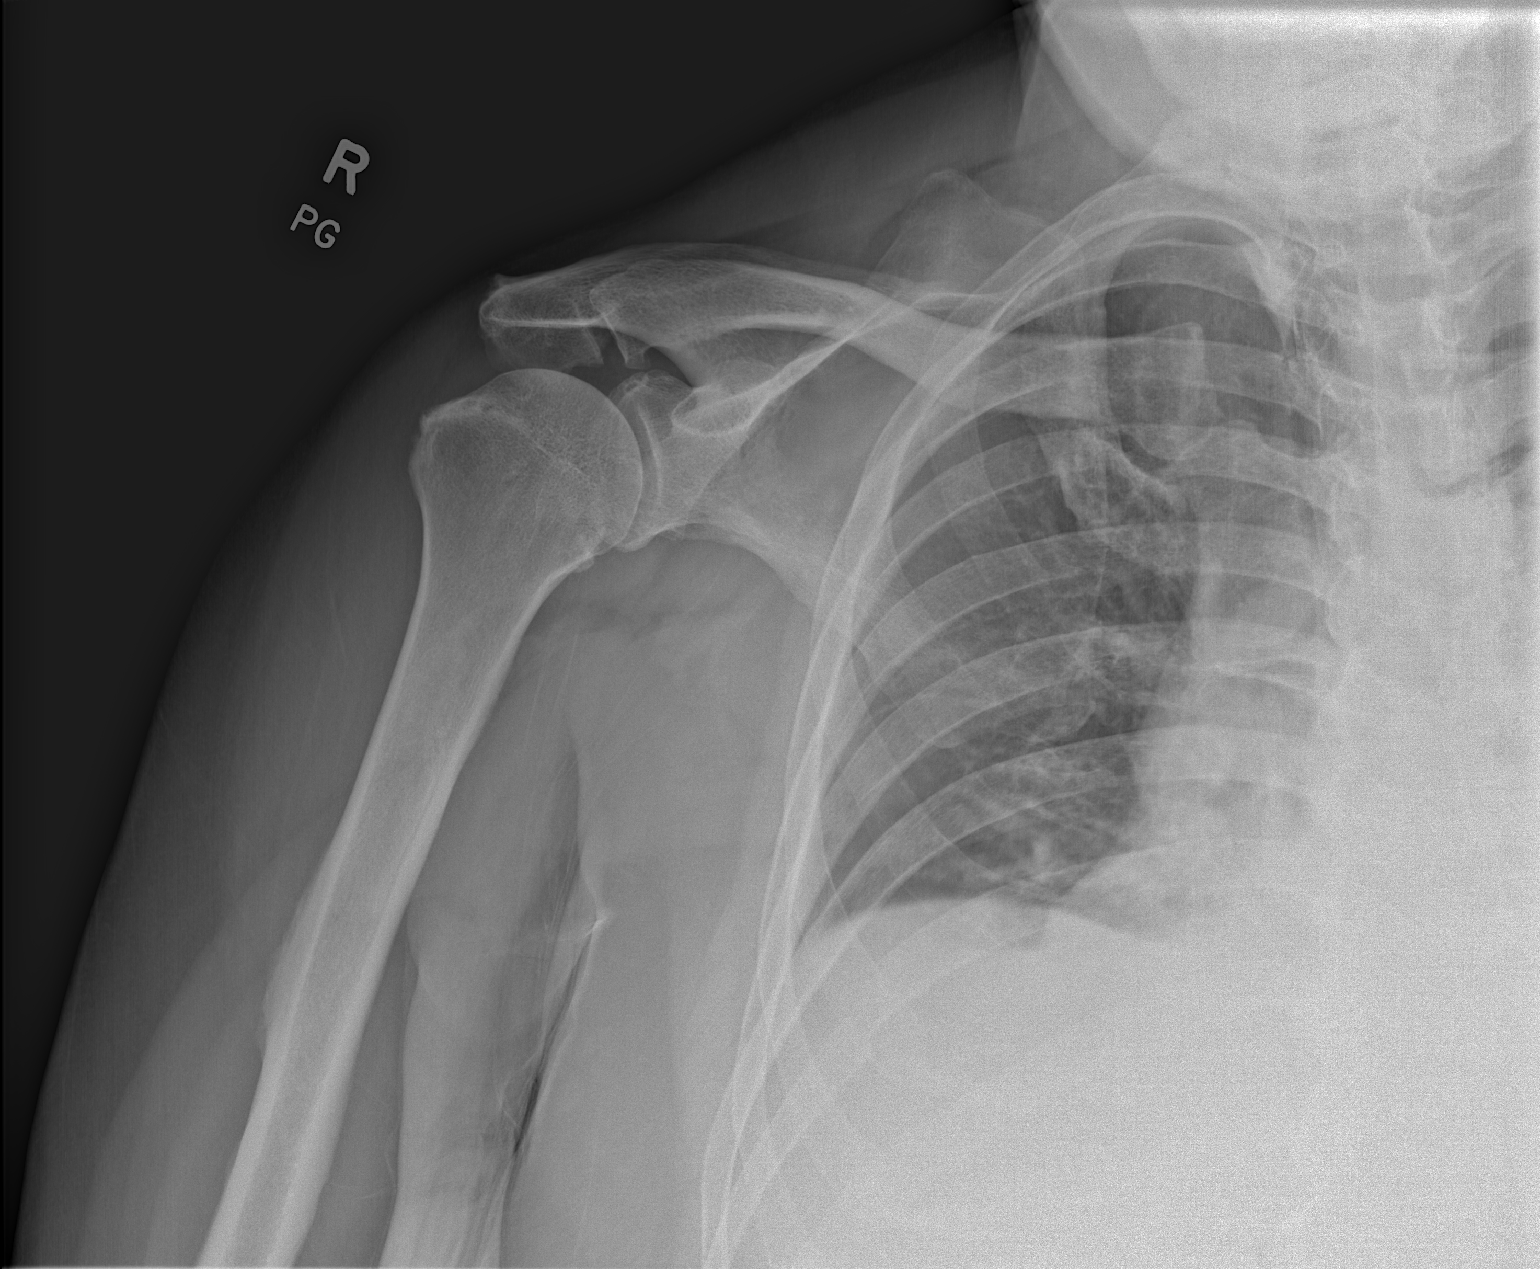

[w shoulder y-view right]
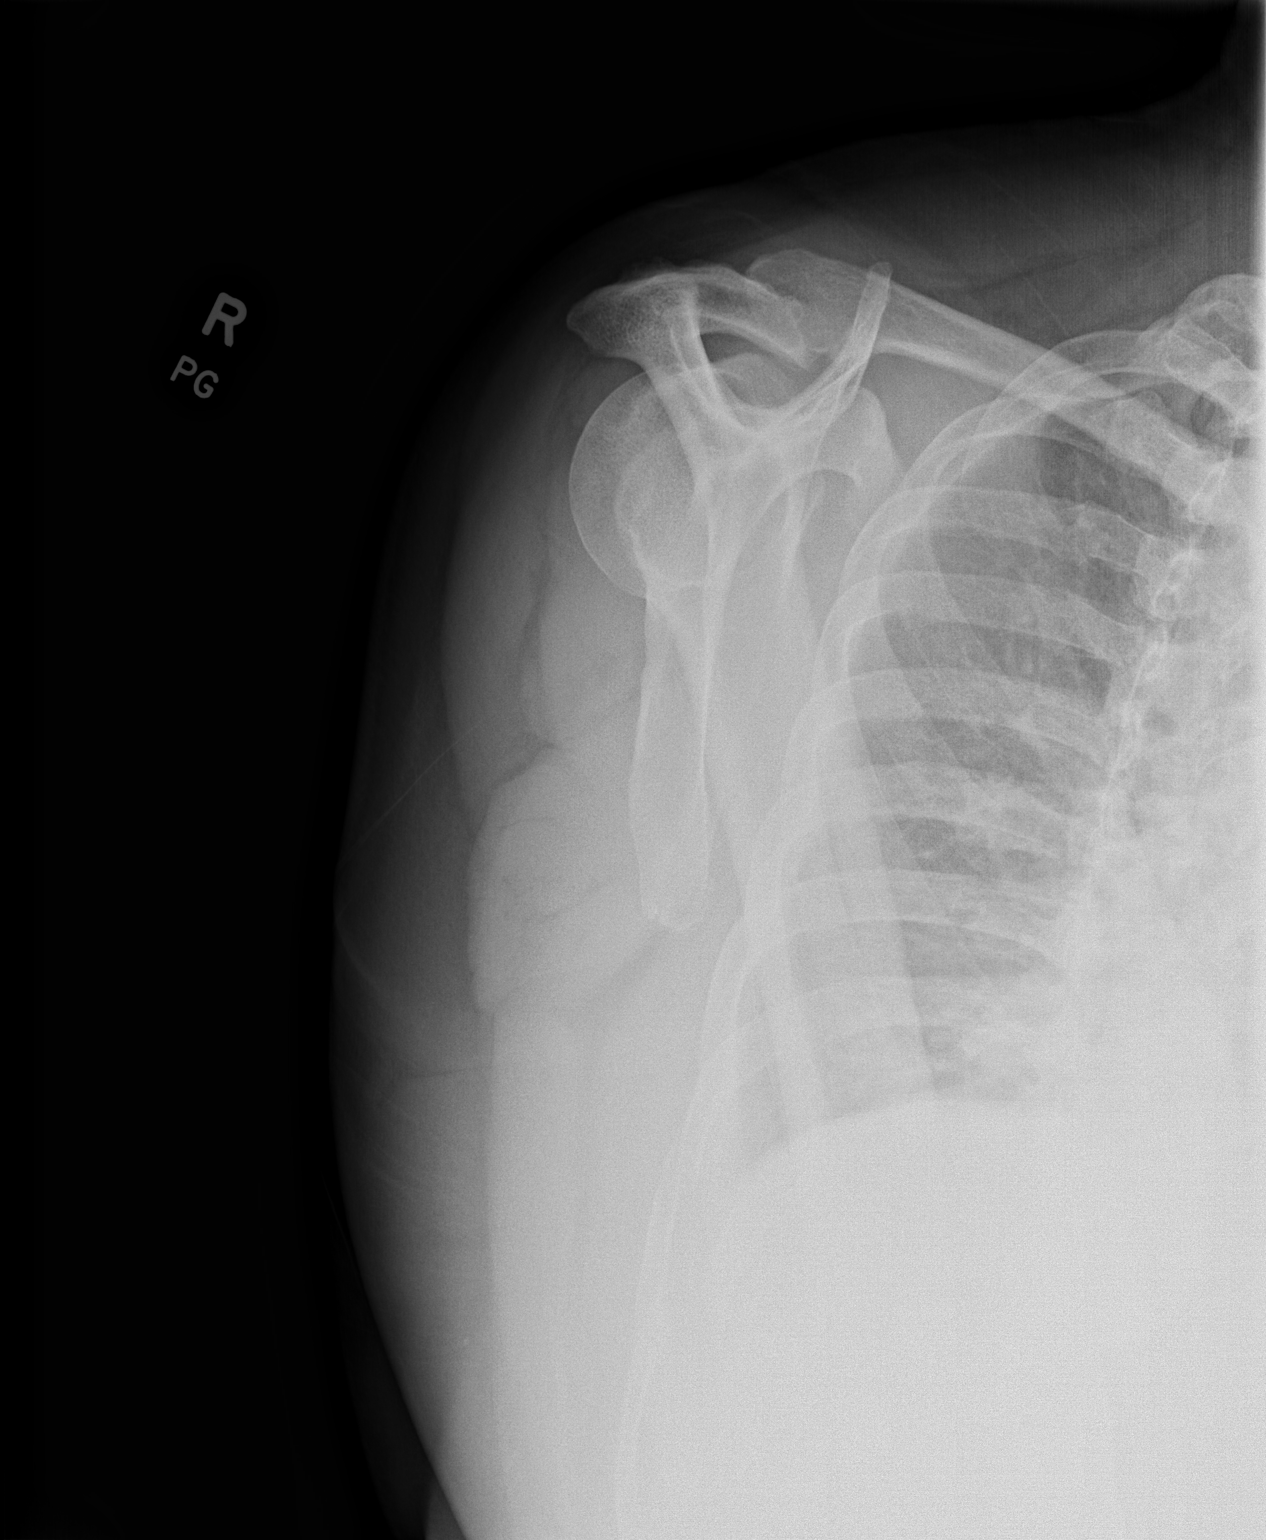

[x shoulder axillary right]
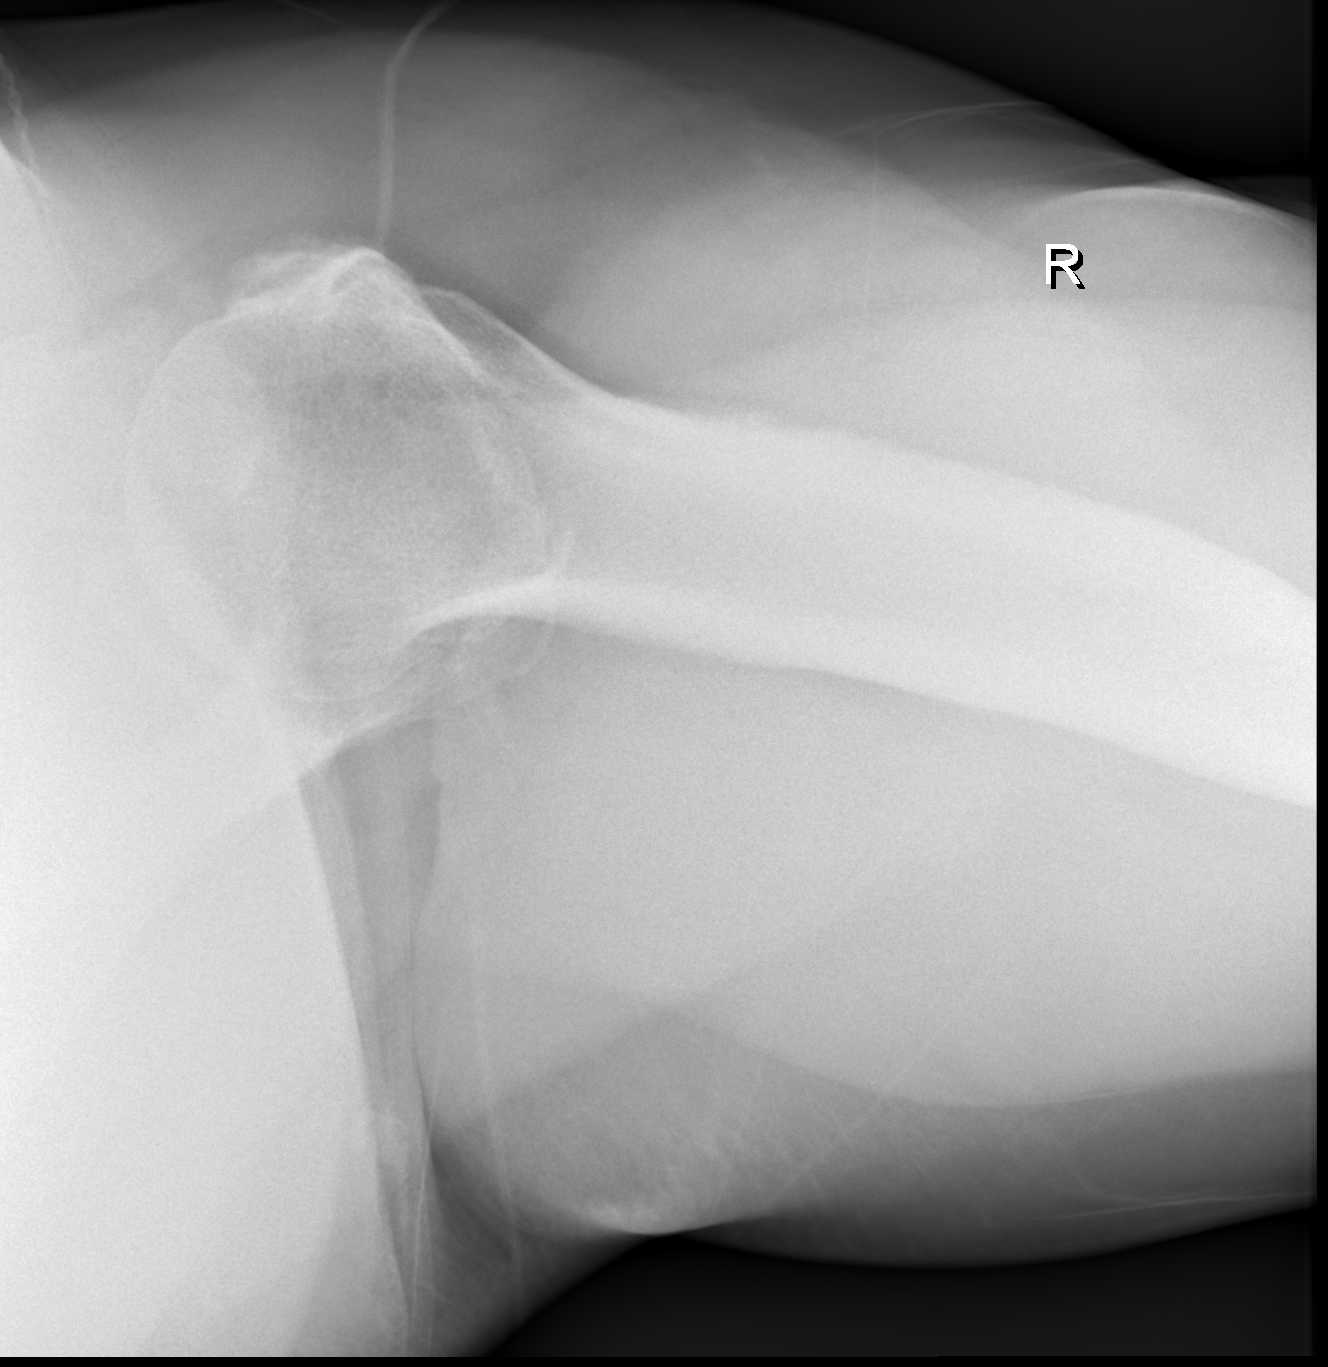

[3 of 3 positions shown; findings below may reference images not displayed]

FINDINGS: Normal bone mineralization. There is no evidence of shoulder
fracture or dislocation. There are inferior osteophytes at the AC
joint and mild inferior spurring at the glenohumeral joint, slight
spurring along the greater tuberosity with preservation of the
normal acromiohumeral space.

Visualized right lung fields appear clear. There are displaced
fractures incidentally noted of the posterolateral right ninth and
tenth ribs.
IMPRESSION: 1. Degenerative spurs, without evidence of shoulder fractures.
2. Displaced fractures of the posterolateral right ninth and tenth
ribs, and there appears to be a nondisplaced fracture of the
posteromedial right tenth rib as well with an intervening flail
segment. I do not see a pneumothorax.
